# Patient Record
Sex: Female | Born: 1990 | Race: Black or African American | Hispanic: No | Marital: Single | Smoking: Never smoker
Health system: Southern US, Community
[De-identification: ages and names within clinical notes are randomized; demographics above are authoritative.]

## PROBLEM LIST (undated history)

## (undated) ENCOUNTER — Inpatient Hospital Stay (HOSPITAL_COMMUNITY): Payer: Self-pay

## (undated) DIAGNOSIS — Z332 Encounter for elective termination of pregnancy: Secondary | ICD-10-CM

## (undated) DIAGNOSIS — D649 Anemia, unspecified: Secondary | ICD-10-CM

## (undated) HISTORY — DX: Encounter for elective termination of pregnancy: Z33.2

## (undated) HISTORY — PX: INDUCED ABORTION: SHX677

## (undated) HISTORY — PX: TONSILLECTOMY AND ADENOIDECTOMY: SHX28

---
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
## 2011-01-01 LAB — DIFFERENTIAL
Basophils Absolute: 0.1 10*3/uL (ref 0.0–0.1)
Eosinophils Absolute: 0.1 10*3/uL (ref 0.0–0.7)
Eosinophils Relative: 2 % (ref 0–5)
Lymphocytes Relative: 33 % (ref 12–46)
Lymphs Abs: 1.3 10*3/uL (ref 0.7–4.0)

## 2011-01-01 LAB — URINE MICROSCOPIC-ADD ON

## 2011-01-01 LAB — URINALYSIS, ROUTINE W REFLEX MICROSCOPIC
Glucose, UA: NEGATIVE mg/dL
Specific Gravity, Urine: 1.023 (ref 1.005–1.030)

## 2011-01-01 LAB — COMPREHENSIVE METABOLIC PANEL
ALT: 18 U/L (ref 0–35)
AST: 21 U/L (ref 0–37)
Albumin: 3.5 g/dL (ref 3.5–5.2)
Alkaline Phosphatase: 53 U/L (ref 39–117)
BUN: 4 mg/dL — ABNORMAL LOW (ref 6–23)
GFR calc Af Amer: >60 mL/min (ref >60)
Potassium: 3.3 mEq/L — ABNORMAL LOW (ref 3.5–5.1)
Sodium: 137 mEq/L (ref 135–145)
Total Protein: 6.8 g/dL (ref 6.0–8.3)

## 2011-01-01 LAB — CBC
Platelets: 297 10*3/uL (ref 150–400)
RDW: 13 % (ref 11.5–15.5)

## 2011-01-01 LAB — POCT PREGNANCY, URINE: Preg Test, Ur: NEGATIVE

## 2011-01-01 LAB — LIPASE, BLOOD: Lipase: 20 U/L (ref 11–59)

## 2011-09-30 ENCOUNTER — Encounter: Payer: Self-pay | Admitting: Internal Medicine

## 2012-02-12 ENCOUNTER — Emergency Department (HOSPITAL_COMMUNITY)
Admission: EM | Admit: 2012-02-12 | Discharge: 2012-02-12 | Disposition: A | Discharge status: Home / Self Care | Payer: Self-pay | Attending: Emergency Medicine | Admitting: Emergency Medicine

## 2012-02-12 ENCOUNTER — Encounter (HOSPITAL_COMMUNITY): Payer: Self-pay | Admitting: *Deleted

## 2012-02-12 DIAGNOSIS — N390 Urinary tract infection, site not specified: Secondary | ICD-10-CM | POA: Insufficient documentation

## 2012-02-12 DIAGNOSIS — N949 Unspecified condition associated with female genital organs and menstrual cycle: Secondary | ICD-10-CM | POA: Insufficient documentation

## 2012-02-12 DIAGNOSIS — R300 Dysuria: Secondary | ICD-10-CM | POA: Insufficient documentation

## 2012-02-12 LAB — URINALYSIS, ROUTINE W REFLEX MICROSCOPIC
Ketones, ur: 15 mg/dL — AB
Nitrite: POSITIVE — AB
Specific Gravity, Urine: 1.033 — ABNORMAL HIGH (ref 1.005–1.030)
Urobilinogen, UA: 1 mg/dL (ref 0.0–1.0)
pH: 5.5 (ref 5.0–8.0)

## 2012-02-12 LAB — URINE MICROSCOPIC-ADD ON

## 2012-02-12 LAB — WET PREP, GENITAL

## 2012-02-12 MED ORDER — SULFAMETHOXAZOLE-TMP DS 800-160 MG PO TABS: 1.0000 | ORAL_TABLET | Freq: Two times a day (BID) | ORAL | Status: AC

## 2012-02-12 MED ORDER — PHENAZOPYRIDINE HCL 95 MG PO TABS: 95.0000 mg | ORAL_TABLET | Freq: Three times a day (TID) | ORAL | Status: AC | PRN

## 2012-02-12 MED ORDER — SULFAMETHOXAZOLE-TMP DS 800-160 MG PO TABS
1.0000 | ORAL_TABLET | Freq: Once | ORAL | Status: AC
  Administered 2012-02-12: 1 via ORAL
  Filled 2012-02-12: qty 1

## 2012-02-12 MED ORDER — PHENAZOPYRIDINE HCL 100 MG PO TABS
95.0000 mg | ORAL_TABLET | Freq: Once | ORAL | Status: AC
  Administered 2012-02-12: 100 mg via ORAL
  Filled 2012-02-12: qty 1

## 2012-02-12 NOTE — ED Notes (Signed)
C/o UTI, onset of sx Friday, describes sx of urethral pain & dysuria, sx worse after intercourse, (denies: vaginal sx, nvd, fever or back pain), urine noted to be dark amber, (took AZO PTA).

## 2012-02-12 NOTE — ED Notes (Signed)
Patient moved to CDU 7 for pelvic exam. Pelvic cart set up.

## 2012-02-12 NOTE — ED Provider Notes (Signed)
Medical screening examination/treatment/procedure(s) were performed by non-physician practitioner and as supervising physician I was immediately available for consultation/collaboration.    Ashritha Desrosiers D Maely Clements, MD 02/12/12 0702 

## 2012-02-12 NOTE — ED Provider Notes (Signed)
History     CSN: [id number]  Arrival date & time 02/12/12  4098   First MD Initiated Contact with Patient 02/12/12 0135      Chief Complaint  Patient presents with  . Urinary Tract Infection    (Consider location/radiation/quality/duration/timing/severity/associated sxs/prior treatment) HPI Comments: Patient started with dysuria.  Today, and dyspareunia.  This, afternoon, which is unusual for her.  She tried taking over-the-counter AZO without any relief  Patient is a 21 y.o. female presenting with urinary tract infection. The history is provided by the patient.  Urinary Tract Infection This is a new problem. The current episode started yesterday. The problem occurs constantly.    Past Medical History  Diagnosis Date  . Abortion on demand     Past Surgical History  Procedure Date  . Tonsillectomy and adenoidectomy     History reviewed. No pertinent family history.  History  Substance Use Topics  . Smoking status: Never Smoker   . Smokeless tobacco: Not on file  . Alcohol Use: Yes     occaisionally    OB History    Grav Para Term Preterm Abortions TAB SAB Ect Mult Living                  Review of Systems  Genitourinary: Positive for dysuria, frequency and vaginal pain. Negative for vaginal bleeding, vaginal discharge and genital sores.    Allergies  Review of patient's allergies indicates no known allergies.  Home Medications   Current Outpatient Rx  Name Route Sig Dispense Refill  . CYSTEX PO Oral Take 2 tablets by mouth once.    Marland Kitchen OVER THE COUNTER MEDICATION Oral Take 1 tablet by mouth once. AZO    . PHENAZOPYRIDINE HCL 95 MG PO TABS Oral Take 1 tablet (95 mg total) by mouth 3 (three) times daily as needed for pain. 10 tablet 0  . SULFAMETHOXAZOLE-TMP DS 800-160 MG PO TABS Oral Take 1 tablet by mouth 2 (two) times daily. 10 tablet 0    BP 153/88  Pulse 105  Temp(Src) 98 F (36.7 C) (Oral)  Resp 22  SpO2 99%  LMP 01/30/2012  Physical Exam    Constitutional: She appears well-developed and well-nourished.  HENT:  Head: Normocephalic.  Eyes: Pupils are equal, round, and reactive to light.  Neck: Normal range of motion.  Cardiovascular: Normal rate.   Pulmonary/Chest: Effort normal.  Abdominal: Soft.  Genitourinary: Vagina normal and uterus normal. No vaginal discharge found.  Musculoskeletal: Normal range of motion.  Neurological: She is alert.  Skin: Skin is warm.    ED Course  Procedures (including critical care time)  Labs Reviewed  URINALYSIS, ROUTINE W REFLEX MICROSCOPIC - Abnormal; Notable for the following:    Color, Urine ORANGE (*) BIOCHEMICALS MAY BE AFFECTED BY COLOR   APPearance TURBID (*)    Specific Gravity, Urine 1.033 (*)    Hgb urine dipstick LARGE (*)    Bilirubin Urine SMALL (*)    Ketones, ur 15 (*)    Protein, ur >300 (*)    Nitrite POSITIVE (*)    Leukocytes, UA LARGE (*)    All other components within normal limits  URINE MICROSCOPIC-ADD ON - Abnormal; Notable for the following:    Squamous Epithelial / LPF MANY (*)    Bacteria, UA FEW (*)    All other components within normal limits  POCT PREGNANCY, URINE  GC/CHLAMYDIA PROBE AMP, GENITAL  WET PREP, GENITAL   No results found.   1. Urinary tract infection  MDM   After review of patient's urine, she has a urinary tract infection, which I will treat with Pyridium and Septra or vaginal exam was benign and noncontributory to her symptoms        Arman Filter, NP 02/12/12 0320  Arman Filter, NP 02/12/12 1610  Arman Filter, NP 02/12/12 [id number]

## 2012-02-12 NOTE — Discharge Instructions (Signed)
Urinary Tract Infection A urinary tract infection (UTI) is often caused by a germ (bacteria). A UTI is usually helped with medicine (antibiotics) that kills germs. Take all the medicine until it is gone. Do this even if you are feeling better. You are usually better in 7 to 10 days. HOME CARE   Drink enough water and fluids to keep your pee (urine) clear or pale yellow. Drink:   Cranberry juice.   Water.   Avoid:   Caffeine.   Tea.   Bubbly (carbonated) drinks.   Alcohol.   Only take medicine as told by your doctor.   To prevent further infections:   Pee often.   After pooping (bowel movement), women should wipe from front to back. Use each tissue only once.   Pee before and after having sex (intercourse).  Ask your doctor when your test results will be ready. Make sure you follow up and get your test results.  GET HELP RIGHT AWAY IF:   There is very bad back pain or lower belly (abdominal) pain.   You get the chills.   You have a fever.   Your baby is older than 3 months with a rectal temperature of 102 F (38.9 C) or higher.   Your baby is 3 months old or younger with a rectal temperature of 100.4 F (38 C) or higher.   You feel sick to your stomach (nauseous) or throw up (vomit).   There is continued burning with peeing.   Your problems are not better in 3 days. Return sooner if you are getting worse.  MAKE SURE YOU:   Understand these instructions.   Will watch your condition.   Will get help right away if you are not doing well or get worse.  Document Released: 02/19/2008 Document Revised: 08/22/2011 Document Reviewed: 02/19/2008 Exit[employer] Patient Information 2012 [employer]. 

## 2012-02-13 LAB — GC/CHLAMYDIA PROBE AMP, GENITAL: Chlamydia, DNA Probe: NEGATIVE

## 2014-09-16 NOTE — L&D Delivery Note (Signed)
Delivery Note At 4:31 PM a viable female was delivered via Vaginal, Spontaneous Delivery (Presentation: Left Occiput Anterior).  APGAR: 9, 9; weight pending .   Placenta status: Intact, Spontaneous.  Cord: 3 vessels with the following complications: None.  Cord pH: n/a  Anesthesia: Epidural  Episiotomy: None Lacerations: Labial, right and left Suture Repair: 3.0 chromic Est. Blood Loss (mL):  Less than 300  Bladder drained with sterile technique ~ 200 cc.  Pt with large hemorrhoid.  Not thrombosed.  Anusol HC inserted s/p delivery.  Mom to postpartum.  Baby to Couplet care / Skin to Skin.  Geryl Rankins 04/28/2015, 5:11 PM

## 2014-09-30 LAB — OB RESULTS CONSOLE RUBELLA ANTIBODY, IGM: RUBELLA: IMMUNE

## 2014-09-30 LAB — OB RESULTS CONSOLE HIV ANTIBODY (ROUTINE TESTING): HIV: NONREACTIVE

## 2014-09-30 LAB — OB RESULTS CONSOLE RPR: RPR: NONREACTIVE

## 2014-09-30 LAB — OB RESULTS CONSOLE HEPATITIS B SURFACE ANTIGEN: HEP B S AG: NEGATIVE

## 2014-10-28 ENCOUNTER — Other Ambulatory Visit (HOSPITAL_COMMUNITY)
Admission: RE | Admit: 2014-10-28 | Discharge: 2014-10-28 | Disposition: A | Discharge status: Home / Self Care | Payer: BLUE CROSS/BLUE SHIELD | Source: Ambulatory Visit | Attending: Obstetrics & Gynecology | Admitting: Obstetrics & Gynecology

## 2014-10-28 ENCOUNTER — Other Ambulatory Visit: Payer: Self-pay | Admitting: Obstetrics & Gynecology

## 2014-10-28 DIAGNOSIS — Z01419 Encounter for gynecological examination (general) (routine) without abnormal findings: Secondary | ICD-10-CM | POA: Insufficient documentation

## 2014-10-28 DIAGNOSIS — Z113 Encounter for screening for infections with a predominantly sexual mode of transmission: Secondary | ICD-10-CM | POA: Diagnosis present

## 2014-10-31 LAB — CYTOLOGY - PAP

## 2014-11-29 ENCOUNTER — Encounter (HOSPITAL_COMMUNITY): Payer: Self-pay | Admitting: *Deleted

## 2014-11-29 ENCOUNTER — Inpatient Hospital Stay (HOSPITAL_COMMUNITY)
Admission: AD | Admit: 2014-11-29 | Discharge: 2014-11-29 | Disposition: A | Discharge status: Home / Self Care | Payer: BLUE CROSS/BLUE SHIELD | Source: Ambulatory Visit | Attending: Obstetrics & Gynecology | Admitting: Obstetrics & Gynecology

## 2014-11-29 DIAGNOSIS — Z3A16 16 weeks gestation of pregnancy: Secondary | ICD-10-CM | POA: Diagnosis not present

## 2014-11-29 DIAGNOSIS — R55 Syncope and collapse: Secondary | ICD-10-CM | POA: Diagnosis not present

## 2014-11-29 DIAGNOSIS — O210 Mild hyperemesis gravidarum: Secondary | ICD-10-CM | POA: Diagnosis not present

## 2014-11-29 DIAGNOSIS — O9989 Other specified diseases and conditions complicating pregnancy, childbirth and the puerperium: Secondary | ICD-10-CM | POA: Diagnosis not present

## 2014-11-29 LAB — URINALYSIS, ROUTINE W REFLEX MICROSCOPIC
Bilirubin Urine: NEGATIVE
Glucose, UA: NEGATIVE mg/dL
Hgb urine dipstick: NEGATIVE
KETONES UR: NEGATIVE mg/dL
LEUKOCYTES UA: NEGATIVE
NITRITE: NEGATIVE
PH: 5.5 (ref 5.0–8.0)
PROTEIN: NEGATIVE mg/dL
Specific Gravity, Urine: <1.005 — ABNORMAL LOW (ref 1.005–1.030)
Urobilinogen, UA: 0.2 mg/dL (ref 0.0–1.0)

## 2014-11-29 LAB — CBC WITH DIFFERENTIAL/PLATELET
BASOS ABS: 0 10*3/uL (ref 0.0–0.1)
BASOS PCT: 0 % (ref 0–1)
EOS ABS: 0.1 10*3/uL (ref 0.0–0.7)
EOS PCT: 1 % (ref 0–5)
HCT: 32.4 % — ABNORMAL LOW (ref 36.0–46.0)
Hemoglobin: 11.5 g/dL — ABNORMAL LOW (ref 12.0–15.0)
Lymphocytes Relative: 21 % (ref 12–46)
Lymphs Abs: 1.3 10*3/uL (ref 0.7–4.0)
MCH: 31.6 pg (ref 26.0–34.0)
MCHC: 35.5 g/dL (ref 30.0–36.0)
MCV: 89 fL (ref 78.0–100.0)
Monocytes Absolute: 0.4 10*3/uL (ref 0.1–1.0)
Monocytes Relative: 7 % (ref 3–12)
NEUTROS PCT: 71 % (ref 43–77)
Neutro Abs: 4.3 10*3/uL (ref 1.7–7.7)
PLATELETS: 234 10*3/uL (ref 150–400)
RBC: 3.64 MIL/uL — ABNORMAL LOW (ref 3.87–5.11)
RDW: 12.3 % (ref 11.5–15.5)
WBC: 6 10*3/uL (ref 4.0–10.5)

## 2014-11-29 LAB — GLUCOSE, CAPILLARY: GLUCOSE-CAPILLARY: 120 mg/dL — AB (ref 70–99)

## 2014-11-29 MED ORDER — LACTATED RINGERS IV BOLUS (SEPSIS)
1000.0000 mL | Freq: Once | INTRAVENOUS | Status: AC
  Administered 2014-11-29: 1000 mL via INTRAVENOUS

## 2014-11-29 NOTE — MAU Note (Signed)
Patient states she was at work when she felt nauseated and like she was going to pass out.  She states walking to the bathroom and next thing she remembers she was on the floor talking to her coworker who called EMS.  Pt presents to MAU alert and oriented.

## 2014-11-29 NOTE — Discharge Instructions (Signed)
It is important that you drink plenty of fluids every day and eat small meals frequently.  I spoke with Dr. Richardson Doppole and she wants you to see Dr. Charlotta Newtonzan this week for follow up. Return here as needed.

## 2014-11-29 NOTE — MAU Provider Note (Signed)
History     CSN: [id number]  Arrival date and time: 11/29/14 1839   First Provider Initiated Contact with Patient 11/29/14 1920      Chief Complaint  Patient presents with  . Nausea  . Near Syncope   HPI  [name] is a 24 y.o. G1P0 at 7445w2d who presents to MAU today by EMS after a syncopal episode. The patient states a lot of N/V throughout the pregnancy that is somewhat controlled with Zofran. She states that she began to feel nauseous at work today, stood up quickly and was walking to the bathroom when she felt dizzy. She states she then woke up on the floor on her side. She denies pain. She think that she fell on her side. She denies vaginal bleeding or discharge today.   OB History    Gravida Para Term Preterm AB TAB SAB Ectopic Multiple Living   1               Past Medical History  Diagnosis Date  . Abortion on demand   . Medical history non-contributory     Past Surgical History  Procedure Laterality Date  . Tonsillectomy and adenoidectomy    . Tonsillectomy      History reviewed. No pertinent family history.  History  Substance Use Topics  . Smoking status: Never Smoker   . Smokeless tobacco: Not on file  . Alcohol Use: No     Comment: occaisionally    Allergies: No Known Allergies  Prescriptions prior to admission  Medication Sig Dispense Refill Last Dose  . metoCLOPramide (REGLAN) 10 MG tablet Take 10 mg by mouth 2 (two) times daily.   Past Month at Unknown time  . ondansetron (ZOFRAN) 4 MG tablet Take 4 mg by mouth every 8 (eight) hours as needed for nausea or vomiting.   Past Week at Unknown time  . Prenatal Vit-Fe Fumarate-FA (PRENATAL MULTIVITAMIN) TABS tablet Take 1 tablet by mouth daily at 12 noon.   11/28/2014 at Unknown time    Review of Systems  Constitutional: Negative for fever and malaise/fatigue.  Gastrointestinal: Positive for nausea, vomiting and abdominal pain. Negative for diarrhea and constipation.  Genitourinary:  Negative for dysuria, urgency and frequency.       Neg - vaginal bleeding, discharge  Neurological: Positive for dizziness and weakness. Negative for loss of consciousness.   Physical Exam   Blood pressure 119/63, pulse 77, temperature 98.2 F (36.8 C), temperature source Oral, resp. rate 18, height 5\' 5"  (1.651 m), weight 165 lb (74.844 kg), last menstrual period 08/07/2014, SpO2 100 %.  Physical Exam  Constitutional: She is oriented to person, place, and time. She appears well-developed and well-nourished. No distress.  HENT:  Head: Normocephalic.  Cardiovascular: Normal rate.   Respiratory: Effort normal.  GI: Soft. She exhibits no distension and no mass. There is no tenderness. There is no rebound and no guarding.  Neurological: She is alert and oriented to person, place, and time.  Skin: Skin is warm and dry. No erythema.  Psychiatric: She has a normal mood and affect.   Results for orders placed or performed during the hospital encounter of 11/29/14 (from the past 24 hour(s))  CBC with Differential/Platelet     Status: Abnormal   Collection Time: 11/29/14  7:32 PM  Result Value Ref Range   WBC 6.0 4.0 - 10.5 K/uL   RBC 3.64 (L) 3.87 - 5.11 MIL/uL   Hemoglobin 11.5 (L) 12.0 - 15.0 g/dL   HCT  32.4 (L) 36.0 - 46.0 %   MCV 89.0 78.0 - 100.0 fL   MCH 31.6 26.0 - 34.0 pg   MCHC 35.5 30.0 - 36.0 g/dL   RDW 14.7 82.9 - 56.2 %   Platelets 234 150 - 400 K/uL   Neutrophils Relative % 71 43 - 77 %   Neutro Abs 4.3 1.7 - 7.7 K/uL   Lymphocytes Relative 21 12 - 46 %   Lymphs Abs 1.3 0.7 - 4.0 K/uL   Monocytes Relative 7 3 - 12 %   Monocytes Absolute 0.4 0.1 - 1.0 K/uL   Eosinophils Relative 1 0 - 5 %   Eosinophils Absolute 0.1 0.0 - 0.7 K/uL   Basophils Relative 0 0 - 1 %   Basophils Absolute 0.0 0.0 - 0.1 K/uL  Glucose, capillary     Status: Abnormal   Collection Time: 11/29/14  7:32 PM  Result Value Ref Range   Glucose-Capillary 120 (H) 70 - 99 mg/dL    MAU Course   Procedures None  MDM CBC, CBG, EKG today IV fluids ordered 2000 - Care turned over to [facility], NP. Waiting for EKG and receiving IV fluids.   Marny Lowenstein, PA-C  11/29/2014, 8:10 PM   Patient re assessed after IV hydration. She is feeling much better. States she has nausea and does not eat very much and has not had much to drink today.   2122 Consult with Dr. Richardson Dopp, will d/c patient home to follow up in the office later in the week.  Assessment and Plan  24 y.o. female @ [redacted]w[redacted]d gestation with syncopal episode while at work today. Stable for d/c with normal labs and EKG. Will follow up in the office this week. Discussed in detail with the patient plan of care. She voices understanding and agrees with plan.   EKG Normal sinus rhythm, Normal EKG

## 2015-03-13 ENCOUNTER — Encounter (HOSPITAL_COMMUNITY): Payer: Self-pay | Admitting: *Deleted

## 2015-03-13 ENCOUNTER — Inpatient Hospital Stay (HOSPITAL_COMMUNITY)
Admission: AD | Admit: 2015-03-13 | Discharge: 2015-03-13 | Disposition: A | Discharge status: Home / Self Care | Payer: BLUE CROSS/BLUE SHIELD | Source: Ambulatory Visit | Attending: Obstetrics & Gynecology | Admitting: Obstetrics & Gynecology

## 2015-03-13 ENCOUNTER — Inpatient Hospital Stay (HOSPITAL_COMMUNITY): Payer: BLUE CROSS/BLUE SHIELD

## 2015-03-13 DIAGNOSIS — Z3493 Encounter for supervision of normal pregnancy, unspecified, third trimester: Secondary | ICD-10-CM | POA: Diagnosis not present

## 2015-03-13 DIAGNOSIS — O219 Vomiting of pregnancy, unspecified: Secondary | ICD-10-CM

## 2015-03-13 DIAGNOSIS — Z3A31 31 weeks gestation of pregnancy: Secondary | ICD-10-CM | POA: Insufficient documentation

## 2015-03-13 DIAGNOSIS — O368131 Decreased fetal movements, third trimester, fetus 1: Secondary | ICD-10-CM

## 2015-03-13 DIAGNOSIS — O368390 Maternal care for abnormalities of the fetal heart rate or rhythm, unspecified trimester, not applicable or unspecified: Secondary | ICD-10-CM

## 2015-03-13 HISTORY — DX: Anemia, unspecified: D64.9

## 2015-03-13 LAB — URINALYSIS, ROUTINE W REFLEX MICROSCOPIC
GLUCOSE, UA: NEGATIVE mg/dL
Hgb urine dipstick: NEGATIVE
Ketones, ur: 40 mg/dL — AB
LEUKOCYTES UA: NEGATIVE
Nitrite: NEGATIVE
Protein, ur: >300 mg/dL — AB
Specific Gravity, Urine: >1.03 — ABNORMAL HIGH (ref 1.005–1.030)
UROBILINOGEN UA: 1 mg/dL (ref 0.0–1.0)
pH: 6 (ref 5.0–8.0)

## 2015-03-13 LAB — WET PREP, GENITAL
Clue Cells Wet Prep HPF POC: NONE SEEN
TRICH WET PREP: NONE SEEN

## 2015-03-13 LAB — URINE MICROSCOPIC-ADD ON

## 2015-03-13 MED ORDER — FLUCONAZOLE 150 MG PO TABS: 150.0000 mg | ORAL_TABLET | Freq: Once | ORAL | Status: DC

## 2015-03-13 MED ORDER — DEXTROSE 5 % IN LACTATED RINGERS IV BOLUS
1000.0000 mL | Freq: Once | INTRAVENOUS | Status: AC
  Administered 2015-03-13: 1000 mL via INTRAVENOUS

## 2015-03-13 MED ORDER — ONDANSETRON 4 MG PO TBDP: 4.0000 mg | ORAL_TABLET | Freq: Three times a day (TID) | ORAL | Status: DC | PRN

## 2015-03-13 MED ORDER — FLUCONAZOLE 150 MG PO TABS
150.0000 mg | ORAL_TABLET | Freq: Once | ORAL | Status: AC
  Administered 2015-03-13: 150 mg via ORAL
  Filled 2015-03-13: qty 1

## 2015-03-13 MED ORDER — ONDANSETRON HCL 40 MG/20ML IJ SOLN
8.0000 mg | Freq: Once | INTRAMUSCULAR | Status: AC
  Administered 2015-03-13: 8 mg via INTRAVENOUS
  Filled 2015-03-13: qty 4

## 2015-03-13 MED ORDER — LACTATED RINGERS IV BOLUS (SEPSIS)
1000.0000 mL | Freq: Once | INTRAVENOUS | Status: AC
  Administered 2015-03-13: 1000 mL via INTRAVENOUS

## 2015-03-13 MED ORDER — METOCLOPRAMIDE HCL 10 MG PO TABS: 10.0000 mg | ORAL_TABLET | Freq: Three times a day (TID) | ORAL | Status: DC

## 2015-03-13 NOTE — MAU Provider Note (Signed)
History     CSN: [id number]  Arrival date and time: 03/13/15 1410   None     Chief Complaint  Patient presents with  . Nausea  . Emesis  . Decreased Fetal Movement  . Vaginal Bleeding   HPI    [name] is a 24 y.o. female G1P0 at [redacted]w[redacted]d presenting to [facility] with vomiting; she has been vomiting everyday and this has been an ongoing problem throughout her pregnancy. She has vomited 6 times today which is how many times she normally vomits in a day.  She was taking medication for the vomiting however she attempted to stop the medication. She does not have medication at home for N/V  She feels that her babies kicks are much less than normal. She is feeling him kick, however not as much as normally.  Vaginal bleeding; she noticed spotting this morning when she urinated only. She saw it one time and has not noticed it again. She noticed a quarter size amount of blood on the toilet tissue.   OB History    Gravida Para Term Preterm AB TAB SAB Ectopic Multiple Living   1               Past Medical History  Diagnosis Date  . Abortion on demand   . Anemia     Past Surgical History  Procedure Laterality Date  . Tonsillectomy and adenoidectomy    . Tonsillectomy      History reviewed. No pertinent family history.  History  Substance Use Topics  . Smoking status: Never Smoker   . Smokeless tobacco: Not on file  . Alcohol Use: No     Comment: occaisionally    Allergies: No Known Allergies  Prescriptions prior to admission  Medication Sig Dispense Refill Last Dose  . Prenatal Vit-Fe Fumarate-FA (PRENATAL MULTIVITAMIN) TABS tablet Take 1 tablet by mouth daily at 12 noon.   03/12/2015 at Unknown time   Results for orders placed or performed during the hospital encounter of 03/13/15 (from the past 48 hour(s))  Urinalysis, Routine w reflex microscopic (not at [facility])     Status: Abnormal   Collection Time: 03/13/15  2:16 PM  Result Value Ref Range   Color, Urine YELLOW  YELLOW   APPearance CLOUDY (A) CLEAR   Specific Gravity, Urine >1.030 (H) 1.005 - 1.030   pH 6.0 5.0 - 8.0   Glucose, UA NEGATIVE NEGATIVE mg/dL   Hgb urine dipstick NEGATIVE NEGATIVE   Bilirubin Urine SMALL (A) NEGATIVE   Ketones, ur 40 (A) NEGATIVE mg/dL   Protein, ur >329 (A) NEGATIVE mg/dL   Urobilinogen, UA 1.0 0.0 - 1.0 mg/dL   Nitrite NEGATIVE NEGATIVE   Leukocytes, UA NEGATIVE NEGATIVE  Urine microscopic-add on     Status: Abnormal   Collection Time: 03/13/15  2:16 PM  Result Value Ref Range   Squamous Epithelial / LPF FEW (A) RARE   WBC, UA 0-2 <3 WBC/hpf   RBC / HPF 0-2 <3 RBC/hpf   Bacteria, UA RARE RARE   Urine-Other MUCOUS PRESENT   Wet prep, genital     Status: Abnormal   Collection Time: 03/13/15  3:20 PM  Result Value Ref Range   Yeast Wet Prep HPF POC MANY (A) NONE SEEN   Trich, Wet Prep NONE SEEN NONE SEEN   Clue Cells Wet Prep HPF POC NONE SEEN NONE SEEN   WBC, Wet Prep HPF POC MODERATE (A) NONE SEEN    Comment: MODERATE BACTERIA SEEN  Review of Systems  Eyes: Negative for blurred vision.  Gastrointestinal: Positive for nausea, vomiting and abdominal pain (cramping at times ). Negative for diarrhea and constipation.  Neurological: Negative for headaches.   Physical Exam   Blood pressure 133/82, pulse 76, temperature 97.7 F (36.5 C), temperature source Oral, resp. rate 18, height 5\' 5"  (1.651 m), weight 75.932 kg (167 lb 6.4 oz), last menstrual period 08/07/2014.  Physical Exam  Constitutional: She is oriented to person, place, and time. She appears well-developed and well-nourished. No distress.  HENT:  Head: Normocephalic.  Eyes: Pupils are equal, round, and reactive to light.  Neck: Neck supple.  Genitourinary:  Speculum exam: Vagina - large amount of thick, white, clumpy discharge, no odor Cervix - No contact bleeding Bimanual exam: Cervix closed, posterior  Uterus non tender, normal size Adnexa non tender, no masses bilaterally  Wet prep  done Chaperone present for exam.  Neurological: She is alert and oriented to person, place, and time.  Skin: Skin is warm. She is not diaphoretic.  Psychiatric: Her behavior is normal.    Fetal Tracing: Baseline: 120 Variability: moderate Accelerations: 15x15 Decelerations: quick variables  Toco: UI  MAU Course  Procedures  None  MDM  D5LR bolus   Zofran 8 mg IV  Diflucan 150 mg PO   BPP 8/8  LR bolus   Discussed this patient with Dr. Charlotta Newtonzan   Assessment and Plan   A:  1. Nausea and vomiting in pregnancy   2. Variable fetal heart rate decelerations, antepartum   3. Decreased fetal movement, third trimester, fetus 1     P:  Discharge home in stable condition Fetal kick counts  RX: Reglan and Zofran Follow up with Dr. Chestine Sporelark as scheduled  Preterm labor precautions  Return to MAU if symptoms worsen   Duane LopeJennifer I Rasch, NP 03/13/2015 3:28 PM

## 2015-03-13 NOTE — Discharge Instructions (Signed)
Hyperemesis Gravidarum °Hyperemesis gravidarum is a severe form of nausea and vomiting that happens during pregnancy. Hyperemesis is worse than morning sickness. It may cause you to have nausea or vomiting all day for many days. It may keep you from eating and drinking enough food and liquids. Hyperemesis usually occurs during the first half (the first 20 weeks) of pregnancy. It often goes away once a woman is in her second half of pregnancy. However, sometimes hyperemesis continues through an entire pregnancy.  °CAUSES  °The cause of this condition is not completely known but is thought to be related to changes in the body's hormones when pregnant. It could be from the high level of the pregnancy hormone or an increase in estrogen in the body.  °SIGNS AND SYMPTOMS  °· Severe nausea and vomiting. °· Nausea that does not go away. °· Vomiting that does not allow you to keep any food down. °· Weight loss and body fluid loss (dehydration). °· Having no desire to eat or not liking food you have previously enjoyed. °DIAGNOSIS  °Your health care provider will do a physical exam and ask you about your symptoms. He or she may also order blood tests and urine tests to make sure something else is not causing the problem.  °TREATMENT  °You may only need medicine to control the problem. If medicines do not control the nausea and vomiting, you will be treated in the hospital to prevent dehydration, increased acid in the blood (acidosis), weight loss, and changes in the electrolytes in your body that may harm the unborn baby (fetus). You may need IV fluids.  °HOME CARE INSTRUCTIONS  °· Only take over-the-counter or prescription medicines as directed by your health care provider. °· Try eating a couple of dry crackers or toast in the morning before getting out of bed. °· Avoid foods and smells that upset your stomach. °· Avoid fatty and spicy foods. °· Eat 5-6 small meals a day. °· Do not drink when eating meals. Drink between  meals. °· For snacks, eat high-protein foods, such as cheese. °· Eat or suck on things that have ginger in them. Ginger helps nausea. °· Avoid food preparation. The smell of food can spoil your appetite. °· Avoid iron pills and iron in your multivitamins until after 3-4 months of being pregnant. However, consult with your health care provider before stopping any prescribed iron pills. °SEEK MEDICAL CARE IF:  °· Your abdominal pain increases. °· You have a severe headache. °· You have vision problems. °· You are losing weight. °SEEK IMMEDIATE MEDICAL CARE IF:  °· You are unable to keep fluids down. °· You vomit blood. °· You have constant nausea and vomiting. °· You have excessive weakness. °· You have extreme thirst. °· You have dizziness or fainting. °· You have a fever or persistent symptoms for more than 2-3 days. °· You have a fever and your symptoms suddenly get worse. °MAKE SURE YOU:  °· Understand these instructions. °· Will watch your condition. °· Will get help right away if you are not doing well or get worse. °Document Released: 09/02/2005 Document Revised: 06/23/2013 Document Reviewed: 04/14/2013 °[employer]® Patient Information ©2015 [employer]. This information is not intended to replace advice given to you by your health care provider. Make sure you discuss any questions you have with your health care provider. ° °

## 2015-03-13 NOTE — MAU Note (Signed)
Pt presents complaining of vomiting that has been going on the whole pregnancy but was worse this am. Had some spotting with wiping. Also reports decreased fetal movement.

## 2015-04-13 LAB — OB RESULTS CONSOLE GBS: GBS: NEGATIVE

## 2015-04-14 ENCOUNTER — Inpatient Hospital Stay (HOSPITAL_COMMUNITY)
Admission: AD | Admit: 2015-04-14 | Discharge: 2015-04-14 | Disposition: A | Discharge status: Home / Self Care | Payer: BLUE CROSS/BLUE SHIELD | Source: Ambulatory Visit | Attending: Obstetrics & Gynecology | Admitting: Obstetrics & Gynecology

## 2015-04-14 ENCOUNTER — Encounter (HOSPITAL_COMMUNITY): Payer: Self-pay | Admitting: *Deleted

## 2015-04-14 DIAGNOSIS — Z3A35 35 weeks gestation of pregnancy: Secondary | ICD-10-CM | POA: Insufficient documentation

## 2015-04-14 DIAGNOSIS — R030 Elevated blood-pressure reading, without diagnosis of hypertension: Secondary | ICD-10-CM | POA: Diagnosis present

## 2015-04-14 DIAGNOSIS — O133 Gestational [pregnancy-induced] hypertension without significant proteinuria, third trimester: Secondary | ICD-10-CM | POA: Diagnosis not present

## 2015-04-14 LAB — COMPREHENSIVE METABOLIC PANEL
ALBUMIN: 3.1 g/dL — AB (ref 3.5–5.0)
ALT: 11 U/L — ABNORMAL LOW (ref 14–54)
AST: 19 U/L (ref 15–41)
Alkaline Phosphatase: 164 U/L — ABNORMAL HIGH (ref 38–126)
Anion gap: 5 (ref 5–15)
BUN: <5 mg/dL — ABNORMAL LOW (ref 6–20)
CALCIUM: 8.1 mg/dL — AB (ref 8.9–10.3)
CHLORIDE: 109 mmol/L (ref 101–111)
CO2: 20 mmol/L — AB (ref 22–32)
Creatinine, Ser: 0.7 mg/dL (ref 0.44–1.00)
GLUCOSE: 80 mg/dL (ref 65–99)
Potassium: 3.5 mmol/L (ref 3.5–5.1)
Sodium: 134 mmol/L — ABNORMAL LOW (ref 135–145)
TOTAL PROTEIN: 6.2 g/dL — AB (ref 6.5–8.1)
Total Bilirubin: 0.8 mg/dL (ref 0.3–1.2)

## 2015-04-14 LAB — CBC
HCT: 29.7 % — ABNORMAL LOW (ref 36.0–46.0)
HEMOGLOBIN: 10.4 g/dL — AB (ref 12.0–15.0)
MCH: 31 pg (ref 26.0–34.0)
MCHC: 35 g/dL (ref 30.0–36.0)
MCV: 88.4 fL (ref 78.0–100.0)
PLATELETS: 194 10*3/uL (ref 150–400)
RBC: 3.36 MIL/uL — AB (ref 3.87–5.11)
RDW: 12.8 % (ref 11.5–15.5)
WBC: 8.1 10*3/uL (ref 4.0–10.5)

## 2015-04-14 LAB — PROTEIN / CREATININE RATIO, URINE
Creatinine, Urine: 84 mg/dL
Total Protein, Urine: <6 mg/dL

## 2015-04-14 LAB — URINALYSIS, ROUTINE W REFLEX MICROSCOPIC
BILIRUBIN URINE: NEGATIVE
GLUCOSE, UA: NEGATIVE mg/dL
Hgb urine dipstick: NEGATIVE
KETONES UR: NEGATIVE mg/dL
Nitrite: NEGATIVE
Protein, ur: NEGATIVE mg/dL
Specific Gravity, Urine: 1.01 (ref 1.005–1.030)
Urobilinogen, UA: 0.2 mg/dL (ref 0.0–1.0)
pH: 5.5 (ref 5.0–8.0)

## 2015-04-14 LAB — URINE MICROSCOPIC-ADD ON

## 2015-04-14 NOTE — Discharge Instructions (Signed)

## 2015-04-14 NOTE — MAU Provider Note (Signed)
Chief Complaint:  Hypertension   First Provider Initiated Contact with Patient 04/14/15 1424      HPI: Kathleen Mccall is a 24 y.o. G1P0 at [redacted]w[redacted]d who presents to maternity admissions reporting elevated BP's 130-140/90's at home today. BP elevated for the first time at her prenatal visit earlier this week. Reports mild, intermittent headaches over the last week and a half that resolve with resting in a dark room. Has not needed to take any pain medication for these headaches. Also reports seeing dark spots moving away from her field of vision for the past week. Saw her eye doctor today. She states she was told that her vision had not changed but her eye exam showed evidence of hypertension.   Denies contractions, leakage of fluid or vaginal bleeding. Good fetal movement.   Pregnancy Course: Uncomplicated.  Past Medical History: Past Medical History  Diagnosis Date  . Abortion on demand   . Anemia     Past obstetric history: OB History  Gravida Para Term Preterm AB SAB TAB Ectopic Multiple Living  # Outcome Date GA Lbr Len/2nd Weight Sex Delivery Anes PTL Lv  2 Current           1 TAB               Past Surgical History: Past Surgical History  Procedure Laterality Date  . Tonsillectomy and adenoidectomy    . Tonsillectomy    . Induced abortion       Family History: Family History  Problem Relation Age of Onset  . Diabetes Mother   . Diabetes Maternal Grandfather   . Heart disease Maternal Grandfather     Social History: History  Substance Use Topics  . Smoking status: Never Smoker   . Smokeless tobacco: Not on file  . Alcohol Use: No     Comment: occaisionally    Allergies: No Known Allergies  Meds:  Prescriptions prior to admission  Medication Sig Dispense Refill Last Dose  . metoCLOPramide (REGLAN) 10 MG tablet Take 1 tablet (10 mg total) by mouth 3 (three) times daily before meals. 90 tablet 1 04/14/2015 at Unknown time  . ondansetron (ZOFRAN  ODT) 4 MG disintegrating tablet Take 1 tablet (4 mg total) by mouth every 8 (eight) hours as needed for nausea or vomiting. 20 tablet 0 04/13/2015 at Unknown time  . Prenatal Vit-Fe Fumarate-FA (PRENATAL MULTIVITAMIN) TABS tablet Take 1 tablet by mouth daily at 12 noon.   04/14/2015 at Unknown time  . fluconazole (DIFLUCAN) 150 MG tablet Take 1 tablet (150 mg total) by mouth once. Take on 03/16/2015 (Patient not taking: Reported on 04/14/2015) 1 tablet 0 Not Taking at Unknown time    ROS:  Review of Systems  Constitutional: Negative for fever and chills.    Physical Exam  Blood pressure 142/82, pulse 76, temperature 98.9 F (37.2 C), temperature source Oral, resp. rate 16, last menstrual period 08/07/2014. GENERAL: Well-developed, well-nourished female in no acute distress.  HEART: normal rate RESP: normal effort GI: Abd soft, non-tender, gravid appropriate for gestational age. Pos BS x 4 MS: Extremities nontender, no edema, normal ROM NEURO: Alert and oriented x 4.  GU: NEFG, physiologic discharge, no blood, cervix clean. No CVAT    FHT:  Baseline 125 , moderate variability, accelerations present, variable deceleration 1. Monitored for an additional hour. No further decelerations. Contractions: q 2-3 mins, painless.   Labs: Results for orders placed  or performed during the hospital encounter of 04/14/15 (from the past 24 hour(s))  Protein / creatinine ratio, urine     Status: None   Collection Time: 04/14/15  2:10 PM  Result Value Ref Range   Creatinine, Urine 84.00 mg/dL   Total Protein, Urine <6 mg/dL   Protein Creatinine Ratio        0.00 - 0.15 mg/mg[Cre]  Urinalysis, Routine w reflex microscopic (not at [facility])     Status: Abnormal   Collection Time: 04/14/15  2:10 PM  Result Value Ref Range   Color, Urine YELLOW YELLOW   APPearance CLEAR CLEAR   Specific Gravity, Urine 1.010 1.005 - 1.030   pH 5.5 5.0 - 8.0   Glucose, UA NEGATIVE NEGATIVE mg/dL   Hgb urine dipstick NEGATIVE  NEGATIVE   Bilirubin Urine NEGATIVE NEGATIVE   Ketones, ur NEGATIVE NEGATIVE mg/dL   Protein, ur NEGATIVE NEGATIVE mg/dL   Urobilinogen, UA 0.2 0.0 - 1.0 mg/dL   Nitrite NEGATIVE NEGATIVE   Leukocytes, UA SMALL (A) NEGATIVE  Urine microscopic-add on     Status: Abnormal   Collection Time: 04/14/15  2:10 PM  Result Value Ref Range   Squamous Epithelial / LPF FEW (A) RARE   WBC, UA 0-2 <3 WBC/hpf  CBC     Status: Abnormal   Collection Time: 04/14/15  2:40 PM  Result Value Ref Range   WBC 8.1 4.0 - 10.5 K/uL   RBC 3.36 (L) 3.87 - 5.11 MIL/uL   Hemoglobin 10.4 (L) 12.0 - 15.0 g/dL   HCT 40.9 (L) 81.1 - 91.4 %   MCV 88.4 78.0 - 100.0 fL   MCH 31.0 26.0 - 34.0 pg   MCHC 35.0 30.0 - 36.0 g/dL   RDW 78.2 95.6 - 21.3 %   Platelets 194 150 - 400 K/uL  Comprehensive metabolic panel     Status: Abnormal   Collection Time: 04/14/15  2:40 PM  Result Value Ref Range   Sodium 134 (L) 135 - 145 mmol/L   Potassium 3.5 3.5 - 5.1 mmol/L   Chloride 109 101 - 111 mmol/L   CO2 20 (L) 22 - 32 mmol/L   Glucose, Bld 80 65 - 99 mg/dL   BUN <5 (L) 6 - 20 mg/dL   Creatinine, Ser 0.86 0.44 - 1.00 mg/dL   Calcium 8.1 (L) 8.9 - 10.3 mg/dL   Total Protein 6.2 (L) 6.5 - 8.1 g/dL   Albumin 3.1 (L) 3.5 - 5.0 g/dL   AST 19 15 - 41 U/L   ALT 11 (L) 14 - 54 U/L   Alkaline Phosphatase 164 (H) 38 - 126 U/L   Total Bilirubin 0.8 0.3 - 1.2 mg/dL   GFR calc non Af Amer >60 >60 mL/min   GFR calc Af Amer >60 >60 mL/min   Anion gap 5 5 - 15    Imaging:  No results found.  MAU Course: CBC, CMP, UA, protein creatinine ratio.  Discussed blood pressures, lab work, headache and vision changes with Dr. Charlotta Newton. With only borderline to mildly elevated blood pressures and normal lab work, highly doubt headache and vision changes are cerebral edema. Stable for discharge with follow-up in the office in 3 days per Dr. Charlotta Newton.  Assessment: 1. Gestational hypertension w/o significant proteinuria in 3rd trimester     Plan: Discharge home in stable condition per consult with Dr. Charlotta Newton.  Preterm labor precautions and fetal kick counts. Preeclampsia precautions.     Follow-up Information    Follow up  with Myna Hidalgo, M, DO On 04/17/2015.   Specialty:  Obstetrics and Gynecology   Contact information:   [address] [place] [address] [phone]       Follow up with [facility] [place] MATERNITY ADMISSIONS.   Why:  As needed in emergencies   Contact information:   [address] [id number] mc [place] [address] [phone]         Medication List    STOP taking these medications        fluconazole 150 MG tablet  Commonly known as:  DIFLUCAN      TAKE these medications        metoCLOPramide 10 MG tablet  Commonly known as:  REGLAN  Take 1 tablet (10 mg total) by mouth 3 (three) times daily before meals.     ondansetron 4 MG disintegrating tablet  Commonly known as:  ZOFRAN ODT  Take 1 tablet (4 mg total) by mouth every 8 (eight) hours as needed for nausea or vomiting.     prenatal multivitamin Tabs tablet  Take 1 tablet by mouth daily at 12 noon.        [place], [place] 04/14/2015 4:43 PM

## 2015-04-27 ENCOUNTER — Encounter (HOSPITAL_COMMUNITY): Payer: Self-pay | Admitting: *Deleted

## 2015-04-27 ENCOUNTER — Inpatient Hospital Stay (HOSPITAL_COMMUNITY)
Admission: AD | Admit: 2015-04-27 | Discharge: 2015-04-30 | DRG: 775 | Disposition: A | Discharge status: Home / Self Care | Payer: BLUE CROSS/BLUE SHIELD | Source: Ambulatory Visit | Attending: Obstetrics & Gynecology | Admitting: Obstetrics & Gynecology

## 2015-04-27 DIAGNOSIS — F1290 Cannabis use, unspecified, uncomplicated: Secondary | ICD-10-CM | POA: Diagnosis present

## 2015-04-27 DIAGNOSIS — Z3A37 37 weeks gestation of pregnancy: Secondary | ICD-10-CM | POA: Diagnosis present

## 2015-04-27 DIAGNOSIS — O2243 Hemorrhoids in pregnancy, third trimester: Secondary | ICD-10-CM | POA: Diagnosis present

## 2015-04-27 DIAGNOSIS — O365930 Maternal care for other known or suspected poor fetal growth, third trimester, not applicable or unspecified: Secondary | ICD-10-CM | POA: Diagnosis present

## 2015-04-27 DIAGNOSIS — O133 Gestational [pregnancy-induced] hypertension without significant proteinuria, third trimester: Secondary | ICD-10-CM | POA: Diagnosis present

## 2015-04-27 DIAGNOSIS — O99324 Drug use complicating childbirth: Secondary | ICD-10-CM | POA: Diagnosis present

## 2015-04-27 LAB — CBC
HEMATOCRIT: 29.2 % — AB (ref 36.0–46.0)
Hemoglobin: 10 g/dL — ABNORMAL LOW (ref 12.0–15.0)
MCH: 30.3 pg (ref 26.0–34.0)
MCHC: 34.2 g/dL (ref 30.0–36.0)
MCV: 88.5 fL (ref 78.0–100.0)
Platelets: 191 10*3/uL (ref 150–400)
RBC: 3.3 MIL/uL — ABNORMAL LOW (ref 3.87–5.11)
RDW: 12.9 % (ref 11.5–15.5)
WBC: 5.8 10*3/uL (ref 4.0–10.5)

## 2015-04-27 LAB — URINE MICROSCOPIC-ADD ON

## 2015-04-27 LAB — URINALYSIS, ROUTINE W REFLEX MICROSCOPIC
Bilirubin Urine: NEGATIVE
GLUCOSE, UA: NEGATIVE mg/dL
Ketones, ur: NEGATIVE mg/dL
Nitrite: NEGATIVE
Protein, ur: NEGATIVE mg/dL
Specific Gravity, Urine: 1.01 (ref 1.005–1.030)
Urobilinogen, UA: 0.2 mg/dL (ref 0.0–1.0)
pH: 6 (ref 5.0–8.0)

## 2015-04-27 LAB — COMPREHENSIVE METABOLIC PANEL
ALT: 16 U/L (ref 14–54)
AST: 27 U/L (ref 15–41)
Albumin: 3 g/dL — ABNORMAL LOW (ref 3.5–5.0)
Alkaline Phosphatase: 161 U/L — ABNORMAL HIGH (ref 38–126)
Anion gap: 4 — ABNORMAL LOW (ref 5–15)
BUN: 7 mg/dL (ref 6–20)
CALCIUM: 8.5 mg/dL — AB (ref 8.9–10.3)
CO2: 21 mmol/L — AB (ref 22–32)
CREATININE: 0.71 mg/dL (ref 0.44–1.00)
Chloride: 109 mmol/L (ref 101–111)
GFR calc Af Amer: >60 mL/min (ref >60)
GLUCOSE: 98 mg/dL (ref 65–99)
Potassium: 3.7 mmol/L (ref 3.5–5.1)
Sodium: 134 mmol/L — ABNORMAL LOW (ref 135–145)
Total Bilirubin: 0.8 mg/dL (ref 0.3–1.2)
Total Protein: 5.9 g/dL — ABNORMAL LOW (ref 6.5–8.1)

## 2015-04-27 LAB — TYPE AND SCREEN
ABO/RH(D): O POS
ANTIBODY SCREEN: NEGATIVE

## 2015-04-27 LAB — PROTEIN / CREATININE RATIO, URINE
Creatinine, Urine: 73 mg/dL
Total Protein, Urine: <6 mg/dL

## 2015-04-27 LAB — ABO/RH: ABO/RH(D): O POS

## 2015-04-27 LAB — URIC ACID: Uric Acid, Serum: 4.1 mg/dL (ref 2.3–6.6)

## 2015-04-27 MED ORDER — MISOPROSTOL 25 MCG QUARTER TABLET
25.0000 ug | ORAL_TABLET | ORAL | Status: DC | PRN
  Administered 2015-04-27: 25 ug via VAGINAL
  Filled 2015-04-27: qty 1
  Filled 2015-04-27 (×2): qty 0.25

## 2015-04-27 MED ORDER — HYDRALAZINE HCL 20 MG/ML IJ SOLN: 10.0000 mg | Freq: Once | INTRAMUSCULAR | Status: DC | PRN

## 2015-04-27 MED ORDER — ACETAMINOPHEN 325 MG PO TABS: 650.0000 mg | ORAL_TABLET | ORAL | Status: DC | PRN

## 2015-04-27 MED ORDER — OXYCODONE-ACETAMINOPHEN 5-325 MG PO TABS: 2.0000 | ORAL_TABLET | ORAL | Status: DC | PRN

## 2015-04-27 MED ORDER — LABETALOL HCL 5 MG/ML IV SOLN
20.0000 mg | INTRAVENOUS | Status: DC | PRN
  Administered 2015-04-28: 40 mg via INTRAVENOUS
  Filled 2015-04-27: qty 8
  Filled 2015-04-27: qty 4

## 2015-04-27 MED ORDER — LABETALOL HCL 5 MG/ML IV SOLN
20.0000 mg | Freq: Once | INTRAVENOUS | Status: AC
  Administered 2015-04-28: 20 mg via INTRAVENOUS
  Filled 2015-04-27: qty 4

## 2015-04-27 MED ORDER — OXYCODONE-ACETAMINOPHEN 5-325 MG PO TABS: 1.0000 | ORAL_TABLET | ORAL | Status: DC | PRN

## 2015-04-27 MED ORDER — LIDOCAINE HCL (PF) 1 % IJ SOLN
30.0000 mL | INTRAMUSCULAR | Status: DC | PRN
  Filled 2015-04-27: qty 30

## 2015-04-27 MED ORDER — TERBUTALINE SULFATE 1 MG/ML IJ SOLN
0.2500 mg | Freq: Once | INTRAMUSCULAR | Status: DC | PRN
  Filled 2015-04-27: qty 1

## 2015-04-27 MED ORDER — ONDANSETRON HCL 4 MG/2ML IJ SOLN
4.0000 mg | Freq: Four times a day (QID) | INTRAMUSCULAR | Status: DC | PRN
  Administered 2015-04-27: 4 mg via INTRAVENOUS
  Filled 2015-04-27: qty 2

## 2015-04-27 MED ORDER — LACTATED RINGERS IV SOLN
INTRAVENOUS | Status: DC
  Administered 2015-04-27 – 2015-04-28 (×4): via INTRAVENOUS

## 2015-04-27 MED ORDER — LACTATED RINGERS IV SOLN
500.0000 mL | INTRAVENOUS | Status: DC | PRN
  Administered 2015-04-27: 500 mL via INTRAVENOUS

## 2015-04-27 MED ORDER — OXYTOCIN BOLUS FROM INFUSION: 500.0000 mL | INTRAVENOUS | Status: DC

## 2015-04-27 MED ORDER — BUTORPHANOL TARTRATE 2 MG/ML IJ SOLN
2.0000 mg | INTRAMUSCULAR | Status: DC | PRN
  Administered 2015-04-27 – 2015-04-28 (×2): 2 mg via INTRAVENOUS
  Filled 2015-04-27 (×3): qty 2

## 2015-04-27 MED ORDER — CITRIC ACID-SODIUM CITRATE 334-500 MG/5ML PO SOLN: 30.0000 mL | ORAL | Status: DC | PRN

## 2015-04-27 MED ORDER — ZOLPIDEM TARTRATE 5 MG PO TABS
5.0000 mg | ORAL_TABLET | Freq: Every evening | ORAL | Status: DC | PRN
  Administered 2015-04-28: 5 mg via ORAL
  Filled 2015-04-27: qty 1

## 2015-04-27 MED ORDER — OXYTOCIN 40 UNITS IN LACTATED RINGERS INFUSION - SIMPLE MED
62.5000 mL/h | INTRAVENOUS | Status: DC
  Administered 2015-04-28: 62.5 mL/h via INTRAVENOUS

## 2015-04-27 NOTE — Progress Notes (Addendum)
OB PN:  S: Pt resting comfortably, no acute complaints  O: BP 145/87 mmHg  Pulse 79  Temp(Src) 98.2 F (36.8 C) (Oral)  Resp 18  Ht  (1.651 m)  Wt 80.287 kg (177 lb)  BMI 29.45 kg/m2  LMP 08/07/2014 BP range: 136-170/87-110  FHT: 115bpm, moderate variablity, + accels, no decels Toco: q2-49min SVE: 1/80/-3, Foley balloon placed without difficulty  Labs: Hgb 10.0, plt 191, BUN/Cr: 7/0.71, AST/ALT: 27/16  A/P: 24 y.o. G2P0010 @ [redacted]w[redacted]d EGA who presents for IOL due to gestational HTN -FWB: NICHD Cat I FHTs -Labor: Foley placed to remain in place x 6hrs, will start Pit per protocol -GBS: negative -Gestational HTN: BP as above, Labetalol protocol ordered due to 170/110; however repeat BP improved.  Will monitor q 1hr and give IV Labetalol if > 160/110.  HA remains unchanged.  PC ratio pending.  Labs stable as above.  Myna Hidalgo, DO [phone] (pager) [phone] (office)

## 2015-04-27 NOTE — Progress Notes (Signed)
Kathleen Mccall MRN: [id number]  Subjective: -Care assumed of 24y.o. G2P0 at 37.4wks who is a patient of [facility].  Patient presents for IOL secondary to [facility].  In room to meet acquaintance.  Mother and FOB at bedside, supportive.  Patient in bathroom.  Patient ambulates to bed without issues.  Denies visual disturbances, epigastric pain, and reports mild headache.  Patient expresses desire to labor and birth without pain medication, but is "open to options."  Reports contractions are bearable.  Objective: BP 144/95 mmHg  Pulse 55  Temp(Src) 98.3 F (36.8 C) (Oral)  Resp 16  Ht 5\' 5"  (1.651 m)  Wt 80.287 kg (177 lb)  BMI 29.45 kg/m2  SpO2 99%  LMP 08/07/2014       Results for orders placed or performed during the hospital encounter of 04/27/15 (from the past 24 hour(s))  CBC     Status: Abnormal   Collection Time: 04/27/15 12:35 PM  Result Value Ref Range   WBC 5.8 4.0 - 10.5 K/uL   RBC 3.30 (L) 3.87 - 5.11 MIL/uL   Hemoglobin 10.0 (L) 12.0 - 15.0 g/dL   HCT 40.9 (L) 81.1 - 91.4 %   MCV 88.5 78.0 - 100.0 fL   MCH 30.3 26.0 - 34.0 pg   MCHC 34.2 30.0 - 36.0 g/dL   RDW 78.2 95.6 - 21.3 %   Platelets 191 150 - 400 K/uL  Type and screen     Status: None   Collection Time: 04/27/15 12:35 PM  Result Value Ref Range   ABO/RH(D) O POS    Antibody Screen NEG    Sample Expiration 04/30/2015   Comprehensive metabolic panel     Status: Abnormal   Collection Time: 04/27/15 12:35 PM  Result Value Ref Range   Sodium 134 (L) 135 - 145 mmol/L   Potassium 3.7 3.5 - 5.1 mmol/L   Chloride 109 101 - 111 mmol/L   CO2 21 (L) 22 - 32 mmol/L   Glucose, Bld 98 65 - 99 mg/dL   BUN 7 6 - 20 mg/dL   Creatinine, Ser 0.86 0.44 - 1.00 mg/dL   Calcium 8.5 (L) 8.9 - 10.3 mg/dL   Total Protein 5.9 (L) 6.5 - 8.1 g/dL   Albumin 3.0 (L) 3.5 - 5.0 g/dL   AST 27 15 - 41 U/L   ALT 16 14 - 54 U/L   Alkaline Phosphatase 161 (H) 38 - 126 U/L   Total Bilirubin 0.8 0.3 - 1.2 mg/dL   GFR calc non Af Amer  >60 >60 mL/min   GFR calc Af Amer >60 >60 mL/min   Anion gap 4 (L) 5 - 15  Uric acid     Status: None   Collection Time: 04/27/15 12:35 PM  Result Value Ref Range   Uric Acid, Serum 4.1 2.3 - 6.6 mg/dL  ABO/Rh     Status: None   Collection Time: 04/27/15 12:35 PM  Result Value Ref Range   ABO/RH(D) O POS   Urinalysis, Routine w reflex microscopic (not at [facility])     Status: Abnormal   Collection Time: 04/27/15  5:24 PM  Result Value Ref Range   Color, Urine YELLOW YELLOW   APPearance CLEAR CLEAR   Specific Gravity, Urine 1.010 1.005 - 1.030   pH 6.0 5.0 - 8.0   Glucose, UA NEGATIVE NEGATIVE mg/dL   Hgb urine dipstick TRACE (A) NEGATIVE   Bilirubin Urine NEGATIVE NEGATIVE   Ketones, ur NEGATIVE NEGATIVE mg/dL   Protein, ur NEGATIVE  NEGATIVE mg/dL   Urobilinogen, UA 0.2 0.0 - 1.0 mg/dL   Nitrite NEGATIVE NEGATIVE   Leukocytes, UA SMALL (A) NEGATIVE  Protein / creatinine ratio, urine     Status: None   Collection Time: 04/27/15  5:24 PM  Result Value Ref Range   Creatinine, Urine 73.00 mg/dL   Total Protein, Urine <6 mg/dL   Protein Creatinine Ratio        0.00 - 0.15 mg/mg[Cre]  Urine microscopic-add on     Status: Abnormal   Collection Time: 04/27/15  5:24 PM  Result Value Ref Range   Squamous Epithelial / LPF FEW (A) RARE   WBC, UA 0-2 <3 WBC/hpf   RBC / HPF 0-2 <3 RBC/hpf    FHT: 110 bpm, Mod Var, -Decels, +Accels UC: Q1-24min, palpates mild to moderate   SVE:   Dilation: 1 Effacement (%): 80, 90 Station: -3 Exam by:: e. poore, rn Membranes:Intact Pitocin: None  Assessment:  IUP at 37.4wks Cat I FT  GHTN IOL GBS Negative  Plan: -Discussed POC to include this provider managing throughout the night as well as performing delivery if before 0630. -No questions or concerns -Will order ambien in case patient desires for sleep -Continue other mgmt as ordered  Deborra Phegley LYNN,MSN, CNM 04/27/2015, 9:46 PM

## 2015-04-27 NOTE — H&P (Signed)
HPI: 24 y/o G2P0010 @ [redacted]w[redacted]d estimated gestational age (as dated by LMP c/w ultrasound) presents for IOL.   no Leaking of Fluid,   no Vaginal Bleeding,   no Uterine Contractions,  + Fetal Movement.  ROS: Pt reports slight HA that she rates 2/10, no epigastric pain, no visual changes.    Pregnancy complicated by: 1) Gestational HTN  -ruled out for preeclampsia 7/27, normal labs including PC ratio 0.19  -Pt has had mild HA throughout pregnancy controlled with tylenol, recently tried Fioricet  -last NST performed 8/9- reactive 2) Size less than dates: @ [redacted]w[redacted]d: vertex/anterior/EFW: 4#11oz (49%), repeat US @ 34wk: 5#8oz (23%) 3) Nausea/vomiting in pregnancy: Zofran, Reglan and Diclegis as needed, denies recent episode of vomiting   Prenatal Transfer Tool  Maternal Diabetes: No Genetic Screening: Normal Maternal Ultrasounds/Referrals: Normal Fetal Ultrasounds or other Referrals:  None Maternal Substance Abuse:  No Significant Maternal Medications:  Meds include: Other: Reglan, Zofran, Diclegis Significant Maternal Lab Results: Lab values include: Group B Strep negative   PNL:  GBS neg, Rub Immune, Hep B neg, RPR NR, HIV neg, GC/C neg, glucola:normal Hgb 11.5 Blood type: O positive, antibody negative  OBHx: TAB x 1 PMHx:  none Meds:  PNV, Zofran, Diclegis, Reglan, Fioricet Allergy:  No Known Allergies SurgHx: none SocHx:   no Tobacco, no  EtOH, no Illicit Drugs  O: BP 136/89 mmHg  Pulse 84  Temp(Src) 98.2 F (36.8 C) (Oral)  Resp 18  Ht  (1.651 m)  Wt 80.287 kg (177 lb)  BMI 29.45 kg/m2  LMP 08/07/2014 Gen. AAOx3, NAD CV.  RRR  No murmur.  Resp. CTAB, no wheeze or crackles. Abd. Gravid,  no tenderness,  no rigidity,  no guarding Extr.  no edema B/L , no calf tenderness, no clonus, 2+ DTRs  FHT: 110 baseline, moderate variability, + accels,  no decels Toco: irregular SVE: 1/50/-3, vertex by exam   Labs: see orders  A/P:  24 y.o. G2P0010 @ [redacted]w[redacted]d EGA who presents for  IOL due to gestational HTN -FWB:  NICHD Cat I FHTs -Labor: plan for cytotec per protcol -GBS: negative -Gestational HTN: BP as above, baseline preeclampsia labs to be completed.  Myna Hidalgo, DO [phone] (pager) [phone] (office)

## 2015-04-28 ENCOUNTER — Inpatient Hospital Stay (HOSPITAL_COMMUNITY): Payer: BLUE CROSS/BLUE SHIELD | Admitting: Anesthesiology

## 2015-04-28 ENCOUNTER — Encounter (HOSPITAL_COMMUNITY): Payer: Self-pay | Admitting: Anesthesiology

## 2015-04-28 LAB — CBC
HCT: 31.2 % — ABNORMAL LOW (ref 36.0–46.0)
HEMATOCRIT: 30.7 % — AB (ref 36.0–46.0)
Hemoglobin: 10.5 g/dL — ABNORMAL LOW (ref 12.0–15.0)
Hemoglobin: 10.4 g/dL — ABNORMAL LOW (ref 12.0–15.0)
MCH: 30 pg (ref 26.0–34.0)
MCH: 30.5 pg (ref 26.0–34.0)
MCHC: 33.7 g/dL (ref 30.0–36.0)
MCHC: 33.9 g/dL (ref 30.0–36.0)
MCV: 89.1 fL (ref 78.0–100.0)
MCV: 90 fL (ref 78.0–100.0)
Platelets: 178 10*3/uL (ref 150–400)
Platelets: 194 10*3/uL (ref 150–400)
RBC: 3.5 MIL/uL — ABNORMAL LOW (ref 3.87–5.11)
RBC: 3.41 MIL/uL — ABNORMAL LOW (ref 3.87–5.11)
RDW: 13.2 % (ref 11.5–15.5)
RDW: 13.1 % (ref 11.5–15.5)
WBC: 8.1 10*3/uL (ref 4.0–10.5)
WBC: 10.5 10*3/uL (ref 4.0–10.5)

## 2015-04-28 LAB — HIV ANTIBODY (ROUTINE TESTING W REFLEX): HIV Screen 4th Generation wRfx: NONREACTIVE

## 2015-04-28 LAB — RPR: RPR Ser Ql: NONREACTIVE

## 2015-04-28 MED ORDER — LANOLIN HYDROUS EX OINT: TOPICAL_OINTMENT | CUTANEOUS | Status: DC | PRN

## 2015-04-28 MED ORDER — FENTANYL 2.5 MCG/ML BUPIVACAINE 1/10 % EPIDURAL INFUSION (WH - ANES)
14.0000 mL/h | INTRAMUSCULAR | Status: DC | PRN
  Administered 2015-04-28 (×2): 14 mL/h via EPIDURAL
  Filled 2015-04-28: qty 125

## 2015-04-28 MED ORDER — IBUPROFEN 600 MG PO TABS
600.0000 mg | ORAL_TABLET | Freq: Four times a day (QID) | ORAL | Status: DC
  Administered 2015-04-28 – 2015-04-30 (×7): 600 mg via ORAL
  Filled 2015-04-28 (×7): qty 1

## 2015-04-28 MED ORDER — OXYCODONE-ACETAMINOPHEN 5-325 MG PO TABS: 1.0000 | ORAL_TABLET | ORAL | Status: DC | PRN

## 2015-04-28 MED ORDER — ACETAMINOPHEN 325 MG PO TABS
650.0000 mg | ORAL_TABLET | ORAL | Status: DC | PRN
Start: 2015-04-28 — End: 2015-04-30
  Administered 2015-04-29: 650 mg via ORAL
  Filled 2015-04-28: qty 2

## 2015-04-28 MED ORDER — DIPHENHYDRAMINE HCL 50 MG/ML IJ SOLN: 12.5000 mg | INTRAMUSCULAR | Status: DC | PRN

## 2015-04-28 MED ORDER — WITCH HAZEL-GLYCERIN EX PADS: 1.0000 "application " | MEDICATED_PAD | CUTANEOUS | Status: DC | PRN

## 2015-04-28 MED ORDER — ONDANSETRON HCL 4 MG PO TABS: 4.0000 mg | ORAL_TABLET | ORAL | Status: DC | PRN

## 2015-04-28 MED ORDER — PRENATAL MULTIVITAMIN CH
1.0000 | ORAL_TABLET | Freq: Every day | ORAL | Status: DC
  Administered 2015-04-29 – 2015-04-30 (×2): 1 via ORAL
  Filled 2015-04-28 (×2): qty 1

## 2015-04-28 MED ORDER — ZOLPIDEM TARTRATE 5 MG PO TABS: 5.0000 mg | ORAL_TABLET | Freq: Every evening | ORAL | Status: DC | PRN

## 2015-04-28 MED ORDER — TETANUS-DIPHTH-ACELL PERTUSSIS 5-2.5-18.5 LF-MCG/0.5 IM SUSP: 0.5000 mL | Freq: Once | INTRAMUSCULAR | Status: DC

## 2015-04-28 MED ORDER — HYDROCORTISONE ACETATE 25 MG RE SUPP
25.0000 mg | Freq: Two times a day (BID) | RECTAL | Status: DC | PRN
  Filled 2015-04-28: qty 1

## 2015-04-28 MED ORDER — SIMETHICONE 80 MG PO CHEW: 80.0000 mg | CHEWABLE_TABLET | ORAL | Status: DC | PRN

## 2015-04-28 MED ORDER — DIBUCAINE 1 % RE OINT: 1.0000 "application " | TOPICAL_OINTMENT | RECTAL | Status: DC | PRN

## 2015-04-28 MED ORDER — DIPHENHYDRAMINE HCL 25 MG PO CAPS: 25.0000 mg | ORAL_CAPSULE | Freq: Four times a day (QID) | ORAL | Status: DC | PRN

## 2015-04-28 MED ORDER — EPHEDRINE 5 MG/ML INJ
10.0000 mg | INTRAVENOUS | Status: DC | PRN
  Filled 2015-04-28: qty 2

## 2015-04-28 MED ORDER — OXYCODONE-ACETAMINOPHEN 5-325 MG PO TABS: 2.0000 | ORAL_TABLET | ORAL | Status: DC | PRN

## 2015-04-28 MED ORDER — OXYTOCIN 40 UNITS IN LACTATED RINGERS INFUSION - SIMPLE MED: 62.5000 mL/h | INTRAVENOUS | Status: DC | PRN

## 2015-04-28 MED ORDER — TERBUTALINE SULFATE 1 MG/ML IJ SOLN
0.2500 mg | Freq: Once | INTRAMUSCULAR | Status: DC | PRN
  Filled 2015-04-28: qty 1

## 2015-04-28 MED ORDER — SENNOSIDES-DOCUSATE SODIUM 8.6-50 MG PO TABS
2.0000 | ORAL_TABLET | ORAL | Status: DC
  Administered 2015-04-28 – 2015-04-29 (×2): 2 via ORAL
  Filled 2015-04-28 (×2): qty 2

## 2015-04-28 MED ORDER — PHENYLEPHRINE 40 MCG/ML (10ML) SYRINGE FOR IV PUSH (FOR BLOOD PRESSURE SUPPORT)
80.0000 ug | PREFILLED_SYRINGE | INTRAVENOUS | Status: DC | PRN
  Filled 2015-04-28: qty 2
  Filled 2015-04-28: qty 20

## 2015-04-28 MED ORDER — ONDANSETRON HCL 4 MG/2ML IJ SOLN: 4.0000 mg | INTRAMUSCULAR | Status: DC | PRN

## 2015-04-28 MED ORDER — BENZOCAINE-MENTHOL 20-0.5 % EX AERO: 1.0000 "application " | INHALATION_SPRAY | CUTANEOUS | Status: DC | PRN

## 2015-04-28 MED ORDER — HYDRALAZINE HCL 20 MG/ML IJ SOLN
5.0000 mg | INTRAMUSCULAR | Status: DC | PRN
Start: 2015-04-28 — End: 2015-04-30

## 2015-04-28 MED ORDER — HYDROCORTISONE ACETATE 25 MG RE SUPP
25.0000 mg | Freq: Once | RECTAL | Status: AC
  Administered 2015-04-28: 25 mg via RECTAL
  Filled 2015-04-28: qty 1

## 2015-04-28 MED ORDER — MISOPROSTOL 200 MCG PO TABS: 800.0000 ug | ORAL_TABLET | Freq: Once | ORAL | Status: DC | PRN

## 2015-04-28 MED ORDER — LIDOCAINE HCL (PF) 1 % IJ SOLN
INTRAMUSCULAR | Status: DC | PRN
  Administered 2015-04-28 (×2): 8 mL via EPIDURAL

## 2015-04-28 MED ORDER — MAGNESIUM HYDROXIDE 400 MG/5ML PO SUSP: 30.0000 mL | ORAL | Status: DC | PRN

## 2015-04-28 MED ORDER — LABETALOL HCL 5 MG/ML IV SOLN
20.0000 mg | INTRAVENOUS | Status: DC | PRN
  Administered 2015-04-29: 20 mg via INTRAVENOUS
  Filled 2015-04-28: qty 4

## 2015-04-28 MED ORDER — OXYTOCIN 40 UNITS IN LACTATED RINGERS INFUSION - SIMPLE MED
1.0000 m[IU]/min | INTRAVENOUS | Status: DC
  Administered 2015-04-28: 1 m[IU]/min via INTRAVENOUS
  Filled 2015-04-28: qty 1000

## 2015-04-28 NOTE — Progress Notes (Signed)
Kathleen Mccall is a 24 y.o. G2P0010 at [redacted]w[redacted]d   Subjective: Pt without complaints.  Mild cramping but after AROM contraction 6-7/10.    Objective: BP 143/93 mmHg  Pulse 70  Temp(Src) 98.7 F (37.1 C) (Oral)  Resp 18  Ht  (1.651 m)  Wt 80.287 kg (177 lb)  BMI 29.45 kg/m2  SpO2 99%  LMP 08/07/2014   Total I/O In: 1350.4 [P.O.:820; I.V.:530.4] Out: 875 [Urine:875]  FHT:  FHR: 120s bpm, variability: moderate,  accelerations:  Present,  decelerations:  Present mild variable UC:   regular, every 2 minutes SVE:   Dilation: 5.5 Effacement (%): 70 Station: -2, Ballotable Exam by:: Esperanza Madrazo  AROM clear. IUPC placed posteriorly without difficulty due to lack of cervical change.  Neuro:  1-2+ DTR Narrow arch  Labs: Lab Results  Component Value Date   WBC 5.8 04/27/2015   HGB 10.0* 04/27/2015   HCT 29.2* 04/27/2015   MCV 88.5 04/27/2015   PLT 191 04/27/2015    Assessment / Plan: IOL at  37 5/7 weeks due to Gestational HTN BP normal to mildly elevated.  UOP normal.  No s/sxs of Preeclampsia.  Labor: Expect advancing dilitation s/p AROM. Preeclampsia:  See above Fetal Wellbeing:  Category II Pain Control:  Labor support without medications I/D:  n/a Anticipated MOD:  NSVD  Kreg Earhart 04/28/2015, 1:15 PM

## 2015-04-28 NOTE — Lactation Note (Signed)
This note was copied from the chart of Kathleen Suzzanne Brunkhorst. Lactation Consultation Note  Patient Name: Kathleen Mccall Date: 04/28/2015 Reason for consult: Initial assessment;Infant < 6lbs RN called reporting baby sleepy and not opening mouth wide to latch. LC to see Mom and she was pumping with DEBP, small amount of colostrum visible with pumping. Basic teaching reviewed with parents. Advised to continue to offer breast with feeding ques. If baby continues to be sleepy and not waking to latch, then Mom needs to supplement with EBM or formula since baby is small and may need calories to wake to BF. Parents agree. Lactation brochure left for review, advised of OP services and support group. Advised to call for assist as needed. Supplemental guidelines given to parents.    Materna Has patient been taught Hand Expression?: Yes Does the patient have breastfeeding experience prior to this delivery?: No  Feeding Feeding Type: Breast Fed Length of feed: 20 min  LATCH Score/Interventions Latch: Too sleepy or reluctant, no latch achieved, no sucking elicited. Intervention(s): Skin to skin;Teach feeding cues Intervention(s): Adjust position;Assist with latch;Breast massage  Audible Swallowing: A few with stimulation Intervention(s): Skin to skin;Hand expression Intervention(s): Skin to skin;Hand expression  Type of Nipple: Everted at rest and after stimulation  Comfort (Breast/Nipple): Soft / non-tender     Hold (Positioning): Assistance needed to correctly position infant at breast and maintain latch. Intervention(s): Breastfeeding basics reviewed;Support Pillows;Position options;Skin to skin  LATCH Score: 7  Lactation Tools Discussed/Used Tools: Pump Breast pump type: Double-Electric Breast Pump WIC Program: Yes Initiated by:: RN Date initiated:: 04/28/15   Consult Status Consult Status: Follow-up Date: 04/29/15 Follow-up type: In-patient    Alfred Levins 04/28/2015, 9:53 PM

## 2015-04-28 NOTE — Progress Notes (Signed)
Kathleen Mccall [id number]  Subjective: Strip and Chart Reviewed.  Objective:  Filed Vitals:   04/27/15 2126 04/27/15 2140 04/27/15 2145 04/27/15 2219  BP: 144/95   140/84  Pulse: 66 67 55 62  Temp:      TempSrc:      Resp: 16   16  Height:      Weight:      SpO2:  99% 99%     FHR: 105 bpm, Mod Var, -Decels, +Accels UC: Q1-12min  Assessment: IUP at [redacted]w[redacted]d Cat I FT Foley Bulb GHTN  Plan: -Continue mgmt as ordered  Sabas Sous, CNM 04/28/2015 12:24 AM

## 2015-04-28 NOTE — Progress Notes (Signed)
Kathleen Mccall is a 24 y.o. G2P0010 at [redacted]w[redacted]d  Subjective: Pt without complaints.  Mild discomfort with contractions. No LOF.  Active fetus.  Denies headaches, visual changes, RUQ pain.  Objective: BP 147/94 mmHg  Pulse 72  Temp(Src) 98.3 F (36.8 C) (Oral)  Resp 18  Ht  (1.651 m)  Wt 80.287 kg (177 lb)  BMI 29.45 kg/m2  SpO2 99%  LMP 08/07/2014     UOP not measured.  FHT:  FHR: 110s bpm, variability: moderate,  accelerations:  Present,  decelerations:  Absent UC:   regular, every 2-4 minutes SVE:   Dilation: 5 Effacement (%): 70, 80 Station: -2 Exam by:: e. poore, rn My exam 5/70/-2, ballotable Neuro: DTR 2+ Abd:  No RUQ pain.  Labs: Lab Results  Component Value Date   WBC 5.8 04/27/2015   HGB 10.0* 04/27/2015   HCT 29.2* 04/27/2015   MCV 88.5 04/27/2015   PLT 191 04/27/2015   Labs reviewed normal.  Assessment / Plan: Induction of labor due to gestational hypertension,  progressing well on pitocin  GHTN-Bps normal to mildly elevated.  Labs have been stable.  Monitor UOP, Strict I/Os.  Labor: Progressing normally Preeclampsia:  No s/sxs of Preeclampsia Fetal Wellbeing:  Category I Pain Control:  Labor support without medications I/D:  n/a Anticipated MOD:  NSVD  Kathleen Mccall 04/28/2015, 7:40 AM

## 2015-04-28 NOTE — Anesthesia Procedure Notes (Signed)
Epidural Patient location during procedure: OB Start time: 04/28/2015 3:00 PM End time: 04/28/2015 3:04 PM  Staffing Anesthesiologist: Leilani Able Performed by: anesthesiologist   Preanesthetic Checklist Completed: patient identified, surgical consent, pre-op evaluation, timeout performed, IV checked, risks and benefits discussed and monitors and equipment checked  Epidural Patient position: sitting Prep: site prepped and draped and DuraPrep Patient monitoring: continuous pulse ox and blood pressure Approach: midline Location: L3-L4 Injection technique: LOR air  Needle:  Needle type: Tuohy  Needle gauge: 17 G Needle length: 9 cm and 9 Needle insertion depth: 6 cm Catheter type: closed end flexible Catheter size: 19 Gauge Catheter at skin depth: 11 cm Test dose: negative and Other  Assessment Sensory level: T10 Events: blood not aspirated, injection not painful, no injection resistance, negative IV test and no paresthesia  Additional Notes Reason for block:procedure for pain

## 2015-04-28 NOTE — Anesthesia Preprocedure Evaluation (Signed)
Anesthesia Evaluation  Patient identified by MRN, date of birth, ID band Patient awake    Reviewed: Allergy & Precautions, H&P , NPO status , Patient's Chart, lab work & pertinent test results  Airway Mallampati: II  TM Distance: >3 FB Neck ROM: full    Dental no notable dental hx.    Pulmonary neg pulmonary ROS,    Pulmonary exam normal       Cardiovascular Normal cardiovascular exam    Neuro/Psych negative neurological ROS  negative psych ROS   GI/Hepatic negative GI ROS, Neg liver ROS,   Endo/Other  negative endocrine ROS  Renal/GU negative Renal ROS     Musculoskeletal   Abdominal Normal abdominal exam  (+)   Peds  Hematology   Anesthesia Other Findings   Reproductive/Obstetrics (+) Pregnancy                             Anesthesia Physical Anesthesia Plan  ASA: II  Anesthesia Plan: Epidural   Post-op Pain Management:    Induction:   Airway Management Planned:   Additional Equipment:   Intra-op Plan:   Post-operative Plan:   Informed Consent: I have reviewed the patients History and Physical, chart, labs and discussed the procedure including the risks, benefits and alternatives for the proposed anesthesia with the patient or authorized representative who has indicated his/her understanding and acceptance.     Plan Discussed with:   Anesthesia Plan Comments:         Anesthesia Quick Evaluation

## 2015-04-29 LAB — CBC
HCT: 28.2 % — ABNORMAL LOW (ref 36.0–46.0)
Hemoglobin: 9.5 g/dL — ABNORMAL LOW (ref 12.0–15.0)
MCH: 30.2 pg (ref 26.0–34.0)
MCHC: 33.7 g/dL (ref 30.0–36.0)
MCV: 89.5 fL (ref 78.0–100.0)
Platelets: 204 10*3/uL (ref 150–400)
RBC: 3.15 MIL/uL — ABNORMAL LOW (ref 3.87–5.11)
RDW: 13.1 % (ref 11.5–15.5)
WBC: 15 10*3/uL — AB (ref 4.0–10.5)

## 2015-04-29 LAB — COMPREHENSIVE METABOLIC PANEL
ALT: 14 U/L (ref 14–54)
ANION GAP: 7 (ref 5–15)
AST: 29 U/L (ref 15–41)
Albumin: 2.8 g/dL — ABNORMAL LOW (ref 3.5–5.0)
Alkaline Phosphatase: 135 U/L — ABNORMAL HIGH (ref 38–126)
CALCIUM: 8.6 mg/dL — AB (ref 8.9–10.3)
CO2: 25 mmol/L (ref 22–32)
Chloride: 106 mmol/L (ref 101–111)
Creatinine, Ser: 0.74 mg/dL (ref 0.44–1.00)
GFR calc Af Amer: >60 mL/min (ref >60)
GFR calc non Af Amer: >60 mL/min (ref >60)
GLUCOSE: 82 mg/dL (ref 65–99)
Potassium: 4.4 mmol/L (ref 3.5–5.1)
Sodium: 138 mmol/L (ref 135–145)
TOTAL PROTEIN: 6 g/dL — AB (ref 6.5–8.1)
Total Bilirubin: 0.7 mg/dL (ref 0.3–1.2)

## 2015-04-29 NOTE — Lactation Note (Signed)
This note was copied from the chart of Kathleen Mccall. Lactation Consultation Note  Follow up visit.  Baby is now 10 hours old.  Mom states baby latches with feeding cues and then she post pumps with DEBP. She is obtaining drops of colostrum.  Reassured and discussed milk coming to volume.  Instructed to increase formula supplementation to 10 mls every 3 hours.  Encouraged to call with concerns/assist prn.  Patient Name: Kathleen Casia Corti [id number] Date: 04/29/2015     Maternal Data    Feeding    LATCH Score/Interventions                      Lactation Tools Discussed/Used     Consult Status      Huston Foley 04/29/2015, 12:39 PM

## 2015-04-29 NOTE — Anesthesia Postprocedure Evaluation (Signed)
  Anesthesia Post-op Note  Patient: Medical illustrator  Procedure(s) Performed: * No procedures listed *  Patient Location: Mother/Baby  Anesthesia Type:Epidural  Level of Consciousness: awake and alert   Airway and Oxygen Therapy: Patient Spontanous Breathing  Post-op Pain: mild  Post-op Assessment: Post-op Vital signs reviewed, Patient's Cardiovascular Status Stable, Respiratory Function Stable, No signs of Nausea or vomiting, Pain level controlled, No headache, Spinal receding and Patient able to bend at knees              Post-op Vital Signs: Reviewed  Last Vitals:  Filed Vitals:   04/29/15 0738  BP: 141/80  Pulse: 67  Temp: 36.8 C  Resp: 18    Complications: No apparent anesthesia complications

## 2015-04-29 NOTE — Progress Notes (Signed)
Subjective: Postpartum Day 1: Vaginal delivery, right and left labial lacerations Patient up ad lib, reports no syncope or dizziness.   Denies HA, visual sx, or epigastric pain.  Reports hemorrhoid stable, no increased pain. Feeding:  Breastfeeding, going slowly.  Baby 5 lbs. Contraceptive plan:  Undecided  Hx THC use--meconium testing planned for baby.  Objective: Vital signs in last 24 hours: Temp:  [97.6 F (36.4 C)-98.8 F (37.1 C)] 98.3 F (36.8 C) (08/13 0738) Pulse Rate:  [52-99] 67 (08/13 0738) Resp:  [18-20] 18 (08/13 0738) BP: (137-173)/(68-112) 141/80 mmHg (08/13 0738) SpO2:  [97 %-100 %] 97 % (08/12 1531)   Filed Vitals:   04/28/15 1816 04/28/15 1940 04/28/15 2355 04/29/15 0738  BP: 154/91 138/80 137/81 141/80  Pulse: 78 65 81 67  Temp:  98.8 F (37.1 C) 97.6 F (36.4 C) 98.3 F (36.8 C)  TempSrc:  Oral Oral Oral  Resp: Height:      Weight:      SpO2:        Physical Exam:  General: alert Lochia: appropriate Uterine Fundus: firm Perineum: healing well DVT Evaluation: No evidence of DVT seen on physical exam. Negative Homan's sign.   CBC Latest Ref Rng 04/29/2015 04/28/2015 04/28/2015  WBC 4.0 - 10.5 K/uL 15.0(H) 10.5 8.1  Hemoglobin 12.0 - 15.0 g/dL 1.6(X) 10.4(L) 10.5(L)  Hematocrit 36.0 - 46.0 % 28.2(L) 30.7(L) 31.2(L)  Platelets 150 - 400 K/uL 204 194 178   CMP Latest Ref Rng 04/29/2015 04/27/2015 04/14/2015  Glucose 65 - 99 mg/dL 82 98 80  BUN 6 - 20 mg/dL <0(R) 7 <6(E)  Creatinine 0.44 - 1.00 mg/dL 4.54 0.98 1.19  Sodium 135 - 145 mmol/L 138 134(L) 134(L)  Potassium 3.5 - 5.1 mmol/L 4.4 3.7 3.5  Chloride 101 - 111 mmol/L 106 109 109  CO2 22 - 32 mmol/L 25 21(L) 20(L)  Calcium 8.9 - 10.3 mg/dL 1.4(N) 8.2(N) 8.1(L)  Total Protein 6.5 - 8.1 g/dL 6.0(L) 5.9(L) 6.2(L)  Total Bilirubin 0.3 - 1.2 mg/dL 0.7 0.8 0.8  Alkaline Phos 38 - 126 U/L 135(H) 161(H) 164(H)  AST 15 - 41 U/L ALT 14 - 54 U/L 14 16 11(L)      Assessment/Plan: Status post vaginal delivery day 1. Gestational hypertension--no meds Stable Continue current care. Plan for discharge tomorrow  LC support for feedings. SW consult already ordered.    Nyra Capes 04/29/2015, 11:27 AM

## 2015-04-29 NOTE — Progress Notes (Signed)
S: Call received from RN re: BP 164/109 & H/A. Pt denies visual changes, epigastric pain or difficulty breathing.  O: BPs 162/88 at 1916 and 164/109 at 2117  A: 24 yo G2 now P1011, s/p SVD, postpartum day 1 Gestational HTN Chronic H/A  P: Labetalol 20 mg IV given Consulted Dr. Sallye Ober - recheck BP in 20 min -- if remain above parameters despite IV dose, will begin Labetalol 100 mg bid.  Sherre Scarlet, CNM 04/29/15, 9:34 PM  ADDENDUM: BP 147/92 at 2150 BP 136/79 at 2234  Will hold off on Labetalol po at this time  Sherre Scarlet, CNM 04/29/15, 11:22 PM

## 2015-04-30 MED ORDER — IBUPROFEN 600 MG PO TABS: 600.0000 mg | ORAL_TABLET | Freq: Four times a day (QID) | ORAL | Status: DC | PRN

## 2015-04-30 MED ORDER — LABETALOL HCL 100 MG PO TABS: 100.0000 mg | ORAL_TABLET | Freq: Two times a day (BID) | ORAL | Status: DC

## 2015-04-30 MED ORDER — LABETALOL HCL 100 MG PO TABS
100.0000 mg | ORAL_TABLET | Freq: Two times a day (BID) | ORAL | Status: DC
  Administered 2015-04-30: 100 mg via ORAL
  Filled 2015-04-30: qty 1

## 2015-04-30 NOTE — Discharge Summary (Signed)
  Vaginal Delivery Discharge Summary  Kathleen Mccall  DOB:    [birth year] MRN:    [id number] CSN:    [id number]  Date of admission:                  04/27/15  Date of discharge:                   04/30/15  Procedures this admission:   SVB, repair of right and left labial lacerations  Date of Delivery: 04/28/15  Newborn Data:  Live born female  Birth Weight: 5 lb 0.1 oz (2272 g) APGAR: 9, 9  Baby remains a patient at the time of mother's d/c due to IUGR and weight loss.  Circumcision Plan: Outpatient at [facility]  History of Present Illness:  [name] is a 24 y.o. female, G2P1011, who presents at [redacted]w[redacted]d weeks gestation. The patient has been followed at [facility]. She was admitted for induction of labor due to gestational hypertension. Her pregnancy has been complicated by:  Patient Active Problem List   Diagnosis Date Noted  . Vaginal delivery 04/29/2015  . Gestational hypertension w/o significant proteinuria in 3rd trimester 04/27/2015     Hospital Course:   Admitting Dx:  IUP at 37 4/7 weeks, gestational hypertension GBS Status:  Negative Delivering Clinician: Dr. Dion Body Lacerations/MLE: Right and left labial lacerations Complications: Mild BP elevations continued pp.  Begun on Labetalol 100 mg po BID on the day of d/c.  Intrapartum Procedures: spontaneous vaginal delivery Postpartum Procedures: none Complications-Operative and Postpartum: Bilateral labial lacerations laceration  Discharge Diagnoses: Term Pregnancy-delivered, gestational hypertension, IUGR  Feeding:  breast and bottle  Contraception:  Patient considering Nexplanon or OCPs, but is currrently undecided.  Hemoglobin Results:  CBC Latest Ref Rng 04/29/2015 04/28/2015 04/28/2015  WBC 4.0 - 10.5 K/uL 15.0(H) 10.5 8.1  Hemoglobin 12.0 - 15.0 g/dL 7.8(I) 10.4(L) 10.5(L)  Hematocrit 36.0 - 46.0 % 28.2(L) 30.7(L) 31.2(L)  Platelets 150 - 400 K/uL 204 194 178    Discharge Physical Exam:    General: alert Lochia: appropriate Uterine Fundus: firm Incision: healing well DVT Evaluation: No s/s DVT, 1+ edema, DTR 1+.   Discharge Information:  Activity:           pelvic rest Diet:                routine Medications: Ibuprofen and Labetalol 100 mg po BID Condition:      stable Instructions:  Routine pp instructions   Discharge to: home  Follow-up Information    Follow up with [facility]. Schedule an appointment as soon as possible for a visit in 3 days.   Specialty:  Obstetrics and Gynecology   Why:  Call [facility] to make an appointment this week for blood pressure check. Call [facility] at [phone] to schedule circumcision.   Contact information:   [address] [place] [address] [phone]        Nigel Bridgeman CNM 04/30/2015 12:40 PM

## 2015-04-30 NOTE — Discharge Instructions (Signed)
Postpartum Care After Vaginal Delivery °After you deliver your newborn (postpartum period), the usual stay in the hospital is 24-72 hours. If there were problems with your labor or delivery, or if you have other medical problems, you might be in the hospital longer.  °While you are in the hospital, you will receive help and instructions on how to care for yourself and your newborn during the postpartum period.  °While you are in the hospital: °· Be sure to tell your nurses if you have pain or discomfort, as well as where you feel the pain and what makes the pain worse. °· If you had an incision made near your vagina (episiotomy) or if you had some tearing during delivery, the nurses may put ice packs on your episiotomy or tear. The ice packs may help to reduce the pain and swelling. °· If you are breastfeeding, you may feel uncomfortable contractions of your uterus for a couple of weeks. This is normal. The contractions help your uterus get back to normal size. °· It is normal to have some bleeding after delivery. °¨ For the first 1-3 days after delivery, the flow is red and the amount may be similar to a period. °¨ It is common for the flow to start and stop. °¨ In the first few days, you may pass some small clots. Let your nurses know if you begin to pass large clots or your flow increases. °¨ Do not  flush blood clots down the toilet before having the nurse look at them. °¨ During the next 3-10 days after delivery, your flow should become more watery and pink or brown-tinged in color. °¨ Ten to fourteen days after delivery, your flow should be a small amount of yellowish-white discharge. °¨ The amount of your flow will decrease over the first few weeks after delivery. Your flow may stop in 6-8 weeks. Most women have had their flow stop by 12 weeks after delivery. °· You should change your sanitary pads frequently. °· Wash your hands thoroughly with soap and water for at least 20 seconds after changing pads, using  the toilet, or before holding or feeding your newborn. °· You should feel like you need to empty your bladder within the first 6-8 hours after delivery. °· In case you become weak, lightheaded, or faint, call your nurse before you get out of bed for the first time and before you take a shower for the first time. °· Within the first few days after delivery, your breasts may begin to feel tender and full. This is called engorgement. Breast tenderness usually goes away within 48-72 hours after engorgement occurs. You may also notice milk leaking from your breasts. If you are not breastfeeding, do not stimulate your breasts. Breast stimulation can make your breasts produce more milk. °· Spending as much time as possible with your newborn is very important. During this time, you and your newborn can feel close and get to know each other. Having your newborn stay in your room (rooming in) will help to strengthen the bond with your newborn.  It will give you time to get to know your newborn and become comfortable caring for your newborn. °· Your hormones change after delivery. Sometimes the hormone changes can temporarily cause you to feel sad or tearful. These feelings should not last more than a few days. If these feelings last longer than that, you should talk to your caregiver. °· If desired, talk to your caregiver about methods of family planning or contraception. °·   Talk to your caregiver about immunizations. Your caregiver may want you to have the following immunizations before leaving the hospital: °· Tetanus, diphtheria, and pertussis (Tdap) or tetanus and diphtheria (Td) immunization. It is very important that you and your family (including grandparents) or others caring for your newborn are up-to-date with the Tdap or Td immunizations. The Tdap or Td immunization can help protect your newborn from getting ill. °· Rubella immunization. °· Varicella (chickenpox) immunization. °· Influenza immunization. You should  receive this annual immunization if you did not receive the immunization during your pregnancy. °Document Released: 06/30/2007 Document Revised: 05/27/2012 Document Reviewed: 04/29/2012 °ExitCare® Patient Information ©2015 [employer]. This information is not intended to replace advice given to you by your health care provider. Make sure you discuss any questions you have with your health care provider. ° ° °Iron-Rich Diet °An iron-rich diet contains foods that are good sources of iron. Iron is an important mineral that helps your body produce hemoglobin. Hemoglobin is a protein in red blood cells that carries oxygen to the body's tissues. Sometimes, the iron level in your blood can be low. This may be caused by: °· A lack of iron in your diet. °· Blood loss. °· Times of growth, such as during pregnancy or during a child's growth and development. °Low levels of iron can cause a decrease in the number of red blood cells. This can result in iron deficiency anemia. Iron deficiency anemia symptoms include: °· Tiredness. °· Weakness. °· Irritability. °· Increased chance of infection. °Here are some recommendations for daily iron intake: °· Males older than 25 years of age need 8 mg of iron per day. °· Women ages 19 to 50 need 18 mg of iron per day. °· Pregnant women need 27 mg of iron per day, and women who are over 19 years of age and breastfeeding need 9 mg of iron per day. °· Women over the age of 50 need 8 mg of iron per day. °SOURCES OF IRON °There are 2 types of iron that are found in food: heme iron and nonheme iron. Heme iron is absorbed by the body better than nonheme iron. Heme iron is found in meat, poultry, and fish. Nonheme iron is found in grains, beans, and vegetables. °Heme Iron Sources °Food / Iron (mg) °· Chicken liver, 3 oz (85 g)/ 10 mg °· Beef liver, 3 oz (85 g)/ 5.5 mg °· Oysters, 3 oz (85 g)/ 8 mg °· Beef, 3 oz (85 g)/ 2 to 3 mg °· Shrimp, 3 oz (85 g)/ 2.8 mg °· Turkey, 3 oz (85 g)/ 2  mg °· Chicken, 3 oz (85 g) / 1 mg °· Fish (tuna, halibut), 3 oz (85 g)/ 1 mg °· Pork, 3 oz (85 g)/ 0.9 mg °Nonheme Iron Sources °Food / Iron (mg) °· Ready-to-eat breakfast cereal, iron-fortified / 3.9 to 7 mg °· Tofu, ½ cup / 3.4 mg °· Kidney beans, ½ cup / 2.6 mg °· Baked potato with skin / 2.7 mg °· Asparagus, ½ cup / 2.2 mg °· Avocado / 2 mg °· Dried peaches, ½ cup / 1.6 mg °· Raisins, ½ cup / 1.5 mg °· Soy milk, 1 cup / 1.5 mg °· Whole-wheat bread, 1 slice / 1.2 mg °· Spinach, 1 cup / 0.8 mg °· Broccoli, ½ cup / 0.6 mg °IRON ABSORPTION °Certain foods can decrease the body's absorption of iron. Try to avoid these foods and beverages while eating meals with iron-containing foods: °· Coffee. °· Tea. °·   Fiber. °· Soy. °Foods containing vitamin C can help increase the amount of iron your body absorbs from iron sources, especially from nonheme sources. Eat foods with vitamin C along with iron-containing foods to increase your iron absorption. Foods that are high in vitamin C include many fruits and vegetables. Some good sources are: °· Fresh orange juice. °· Oranges. °· Strawberries. °· Mangoes. °· Grapefruit. °· Red bell peppers. °· Green bell peppers. °· Broccoli. °· Potatoes with skin. °· Tomato juice. °Document Released: 04/16/2005 Document Revised: 11/25/2011 Document Reviewed: 02/21/2011 °ExitCare® Patient Information ©2015 [employer]. This information is not intended to replace advice given to you by your health care provider. Make sure you discuss any questions you have with your health care provider. ° °

## 2015-05-02 ENCOUNTER — Encounter (HOSPITAL_COMMUNITY): Payer: Self-pay | Admitting: *Deleted

## 2015-05-02 ENCOUNTER — Inpatient Hospital Stay (HOSPITAL_COMMUNITY)
Admission: AD | Admit: 2015-05-02 | Discharge: 2015-05-04 | DRG: 776 | Disposition: A | Discharge status: Home / Self Care | Payer: BLUE CROSS/BLUE SHIELD | Source: Ambulatory Visit | Attending: Obstetrics & Gynecology | Admitting: Obstetrics & Gynecology

## 2015-05-02 DIAGNOSIS — O1490 Unspecified pre-eclampsia, unspecified trimester: Secondary | ICD-10-CM

## 2015-05-02 DIAGNOSIS — I158 Other secondary hypertension: Secondary | ICD-10-CM | POA: Diagnosis present

## 2015-05-02 DIAGNOSIS — O133 Gestational [pregnancy-induced] hypertension without significant proteinuria, third trimester: Secondary | ICD-10-CM | POA: Diagnosis present

## 2015-05-02 DIAGNOSIS — O1415 Severe pre-eclampsia, complicating the puerperium: Secondary | ICD-10-CM | POA: Diagnosis present

## 2015-05-02 DIAGNOSIS — O9089 Other complications of the puerperium, not elsewhere classified: Principal | ICD-10-CM | POA: Diagnosis present

## 2015-05-02 DIAGNOSIS — IMO0002 Reserved for concepts with insufficient information to code with codable children: Secondary | ICD-10-CM | POA: Diagnosis present

## 2015-05-02 LAB — COMPREHENSIVE METABOLIC PANEL
ALT: 16 U/L (ref 14–54)
AST: 27 U/L (ref 15–41)
Albumin: 3.4 g/dL — ABNORMAL LOW (ref 3.5–5.0)
Alkaline Phosphatase: 129 U/L — ABNORMAL HIGH (ref 38–126)
Anion gap: 8 (ref 5–15)
BUN: 7 mg/dL (ref 6–20)
CHLORIDE: 106 mmol/L (ref 101–111)
CO2: 23 mmol/L (ref 22–32)
Calcium: 8.8 mg/dL — ABNORMAL LOW (ref 8.9–10.3)
Creatinine, Ser: 0.73 mg/dL (ref 0.44–1.00)
Glucose, Bld: 74 mg/dL (ref 65–99)
POTASSIUM: 4.3 mmol/L (ref 3.5–5.1)
Sodium: 137 mmol/L (ref 135–145)
Total Bilirubin: 0.8 mg/dL (ref 0.3–1.2)
Total Protein: 6.8 g/dL (ref 6.5–8.1)

## 2015-05-02 LAB — PROTEIN / CREATININE RATIO, URINE
Creatinine, Urine: 52 mg/dL
PROTEIN CREATININE RATIO: 0.15 mg/mg{creat} (ref 0.00–0.15)
TOTAL PROTEIN, URINE: 8 mg/dL

## 2015-05-02 LAB — CBC WITH DIFFERENTIAL/PLATELET
BASOS ABS: 0 10*3/uL (ref 0.0–0.1)
Basophils Relative: 0 % (ref 0–1)
EOS PCT: 3 % (ref 0–5)
Eosinophils Absolute: 0.1 10*3/uL (ref 0.0–0.7)
HCT: 31.1 % — ABNORMAL LOW (ref 36.0–46.0)
Hemoglobin: 10.4 g/dL — ABNORMAL LOW (ref 12.0–15.0)
LYMPHS PCT: 21 % (ref 12–46)
Lymphs Abs: 1 10*3/uL (ref 0.7–4.0)
MCH: 30.1 pg (ref 26.0–34.0)
MCHC: 33.4 g/dL (ref 30.0–36.0)
MCV: 90.1 fL (ref 78.0–100.0)
MONO ABS: 0.5 10*3/uL (ref 0.1–1.0)
Monocytes Relative: 10 % (ref 3–12)
Neutro Abs: 3.3 10*3/uL (ref 1.7–7.7)
Neutrophils Relative %: 67 % (ref 43–77)
PLATELETS: 263 10*3/uL (ref 150–400)
RBC: 3.45 MIL/uL — ABNORMAL LOW (ref 3.87–5.11)
RDW: 13.1 % (ref 11.5–15.5)
WBC: 5 10*3/uL (ref 4.0–10.5)

## 2015-05-02 LAB — URINE MICROSCOPIC-ADD ON

## 2015-05-02 LAB — URINALYSIS, ROUTINE W REFLEX MICROSCOPIC
Bilirubin Urine: NEGATIVE
Glucose, UA: NEGATIVE mg/dL
KETONES UR: NEGATIVE mg/dL
Nitrite: NEGATIVE
PROTEIN: NEGATIVE mg/dL
Specific Gravity, Urine: 1.01 (ref 1.005–1.030)
Urobilinogen, UA: 0.2 mg/dL (ref 0.0–1.0)
pH: 7 (ref 5.0–8.0)

## 2015-05-02 LAB — URIC ACID: URIC ACID, SERUM: 4.5 mg/dL (ref 2.3–6.6)

## 2015-05-02 MED ORDER — ZOLPIDEM TARTRATE 5 MG PO TABS: 5.0000 mg | ORAL_TABLET | Freq: Every evening | ORAL | Status: DC | PRN

## 2015-05-02 MED ORDER — LABETALOL HCL 5 MG/ML IV SOLN
20.0000 mg | Freq: Once | INTRAVENOUS | Status: AC
  Administered 2015-05-02: 20 mg via INTRAVENOUS
  Filled 2015-05-02: qty 4

## 2015-05-02 MED ORDER — LACTATED RINGERS IV SOLN
INTRAVENOUS | Status: DC
  Administered 2015-05-02 – 2015-05-03 (×3): via INTRAVENOUS

## 2015-05-02 MED ORDER — LACTATED RINGERS IV SOLN
2.0000 g/h | INTRAVENOUS | Status: AC
  Administered 2015-05-02 – 2015-05-03 (×2): 2 g/h via INTRAVENOUS
  Filled 2015-05-02 (×2): qty 80

## 2015-05-02 MED ORDER — CALCIUM CARBONATE ANTACID 500 MG PO CHEW
2.0000 | CHEWABLE_TABLET | ORAL | Status: DC | PRN
  Filled 2015-05-02: qty 2

## 2015-05-02 MED ORDER — PRENATAL MULTIVITAMIN CH
1.0000 | ORAL_TABLET | Freq: Every day | ORAL | Status: DC
  Administered 2015-05-03 – 2015-05-04 (×2): 1 via ORAL
  Filled 2015-05-02 (×2): qty 1

## 2015-05-02 MED ORDER — LABETALOL HCL 5 MG/ML IV SOLN
20.0000 mg | INTRAVENOUS | Status: DC | PRN
  Administered 2015-05-02: 80 mg via INTRAVENOUS
  Administered 2015-05-02: 40 mg via INTRAVENOUS
  Filled 2015-05-02: qty 16
  Filled 2015-05-02: qty 8

## 2015-05-02 MED ORDER — MAGNESIUM SULFATE BOLUS VIA INFUSION
4.0000 g | Freq: Once | INTRAVENOUS | Status: AC
  Administered 2015-05-02: 4 g via INTRAVENOUS
  Filled 2015-05-02: qty 500

## 2015-05-02 MED ORDER — ACETAMINOPHEN 325 MG PO TABS: 650.0000 mg | ORAL_TABLET | ORAL | Status: DC | PRN

## 2015-05-02 MED ORDER — DOCUSATE SODIUM 100 MG PO CAPS
100.0000 mg | ORAL_CAPSULE | Freq: Every day | ORAL | Status: DC
  Administered 2015-05-02 – 2015-05-03 (×2): 100 mg via ORAL
  Filled 2015-05-02 (×4): qty 1

## 2015-05-02 MED ORDER — LABETALOL HCL 100 MG PO TABS
200.0000 mg | ORAL_TABLET | Freq: Two times a day (BID) | ORAL | Status: DC
  Administered 2015-05-02 – 2015-05-03 (×3): 200 mg via ORAL
  Filled 2015-05-02 (×3): qty 2

## 2015-05-02 MED ORDER — HYDRALAZINE HCL 20 MG/ML IJ SOLN
10.0000 mg | Freq: Once | INTRAMUSCULAR | Status: AC | PRN
  Administered 2015-05-02: 10 mg via INTRAVENOUS
  Filled 2015-05-02: qty 1

## 2015-05-02 NOTE — Progress Notes (Signed)
To antenatal via W/C with baby and family. Condition stable.

## 2015-05-02 NOTE — MAU Provider Note (Signed)
Chief Complaint: Hypertension   First Provider Initiated Contact with Patient 05/02/15 1113      SUBJECTIVE HPI: Kathleen Mccall is a 24 y.o. G2P1011 who presents to maternity admissions on PPD # 4 following NSVD sent from the office for elevated BP.  She reports h/a off and on x several weeks, dull and frontal, unchanged today. The h/a is not relieved by Tylenol.  She had induction of labor for her elevated BP in pregnancy.  She denies visual disturbances or epigastric pain.  She reports light lochia, denies abdominal pain, vaginal itching/burning, urinary symptoms, h/a, dizziness, n/v, or fever/chills.     Headache  This is a recurrent problem. The current episode started 1 to 4 weeks ago. The problem occurs constantly. The problem has been waxing and waning. The pain is located in the frontal region. The pain does not radiate. The pain quality is similar to prior headaches. The quality of the pain is described as dull. The pain is moderate. Pertinent negatives include no dizziness, fever, nausea, photophobia, sinus pressure, visual change, vomiting or weakness. She has tried acetaminophen and NSAIDs for the symptoms. The treatment provided no relief. Her past medical history is significant for hypertension.    Past Medical History  Diagnosis Date  . Abortion on demand   . Anemia    Past Surgical History  Procedure Laterality Date  . Tonsillectomy and adenoidectomy    . Tonsillectomy    . Induced abortion     Social History   Social History  . Marital Status: Single    Spouse Name: N/A  . Number of Children: N/A  . Years of Education: N/A   Occupational History  . Not on file.   Social History Main Topics  . Smoking status: Never Smoker   . Smokeless tobacco: Never Used  . Alcohol Use: No     Comment: occaisionally  . Drug Use: No  . Sexual Activity: Yes     Comment: Implanon 2010   Other Topics Concern  . Not on file   Social History Narrative   No current  facility-administered medications on file prior to encounter.   Current Outpatient Prescriptions on File Prior to Encounter  Medication Sig Dispense Refill  . labetalol (NORMODYNE) 100 MG tablet Take 1 tablet (100 mg total) by mouth 2 (two) times daily. 60 tablet 1  . Prenatal Vit-Fe Fumarate-FA (PRENATAL MULTIVITAMIN) TABS tablet Take 1 tablet by mouth daily at 12 noon.    Marland Kitchen ibuprofen (ADVIL,MOTRIN) 600 MG tablet Take 1 tablet (600 mg total) by mouth every 6 (six) hours as needed. (Patient not taking: Reported on 05/02/2015) 30 tablet 2  . metoCLOPramide (REGLAN) 10 MG tablet Take 1 tablet (10 mg total) by mouth 3 (three) times daily before meals. (Patient not taking: Reported on 05/02/2015) 90 tablet 1  . ondansetron (ZOFRAN ODT) 4 MG disintegrating tablet Take 1 tablet (4 mg total) by mouth every 8 (eight) hours as needed for nausea or vomiting. (Patient not taking: Reported on 05/02/2015) 20 tablet 0   No Known Allergies  ROS:  Review of Systems  Constitutional: Negative for fever, chills and fatigue.  HENT: Negative for sinus pressure.   Eyes: Negative for photophobia.  Gastrointestinal: Negative for nausea, vomiting, diarrhea and constipation.  Genitourinary: Negative for dysuria, frequency, flank pain, vaginal bleeding, vaginal discharge, difficulty urinating, vaginal pain and pelvic pain.  Neurological: Positive for headaches. Negative for dizziness and weakness.  Psychiatric/Behavioral: Negative.      I have reviewed patient's  Past Medical Hx, Surgical Hx, Family Hx, Social Hx, medications and allergies.   Physical Exam   Patient Vitals for the past 24 hrs:  BP Temp Temp src Pulse Resp  05/02/15 1242 (!) 177/104 mmHg - - (!) 59 -  05/02/15 1231 (!) 175/104 mmHg - - 72 -  05/02/15 1223 (!) 185/104 mmHg - - 69 -  05/02/15 1211 (!) 198/106 mmHg - - 64 -  05/02/15 1201 (!) 192/101 mmHg - - (!) 56 -  05/02/15 1151 (!) 176/106 mmHg - - 65 -  05/02/15 1143 194/95 mmHg - - (!) 54 -   05/02/15 1140 191/99 mmHg - - (!) 55 -  05/02/15 1121 (!) 196/104 mmHg - - (!) 57 -  05/02/15 1112 (!) 197/102 mmHg - - (!) 56 16  05/02/15 1101 200/100 mmHg - - (!) 55 -  05/02/15 1059 (!) 196/104 mmHg - - (!) 57 -  05/02/15 1039 (!) 186/103 mmHg - - (!) 56 -  05/02/15 1025 (!) 184/101 mmHg 98.4 F (36.9 C) Oral (!) 55 16   Constitutional: Well-developed, well-nourished female in no acute distress.  HEART: normal rate, heart sounds, regular rhythm RESP: normal effort, lung sounds clear and equal bilaterally GI: Abd soft, non-tender, gravid appropriate for gestational age. Pos BS x 4 MS: Extremities nontender, no edema, normal ROM Neurologic: Alert and oriented x 4.  GU: Neg CVAT.   LAB RESULTS No results found for this or any previous visit (from the past 24 hour(s)).  CBC, CMP, uric acid, LDH, and protein creatinine ratio pending  --/--/O POS, O POS (08/11 1235)   MDM: Pt came from office with verbal orders for preeclampsia labs.  With severe range BPs upon arrival, preeclampsia protocol started and IV labetalol given per protocol.  No significant change in BP after medications.  Dr Charlotta Newton called and pt admitted for magnesium sulfate. Pt stable at time of admission.  ASSESSMENT 1. Preeclampsia in postpartum period, unspecified trimester     PLAN Admit to ICU for magnesium sulfate Preeclampsia protocol orders to continue     Medication List    ASK your doctor about these medications        ibuprofen 600 MG tablet  Commonly known as:  ADVIL,MOTRIN  Take 1 tablet (600 mg total) by mouth every 6 (six) hours as needed.     labetalol 100 MG tablet  Commonly known as:  NORMODYNE  Take 1 tablet (100 mg total) by mouth 2 (two) times daily.     metoCLOPramide 10 MG tablet  Commonly known as:  REGLAN  Take 1 tablet (10 mg total) by mouth 3 (three) times daily before meals.     ondansetron 4 MG disintegrating tablet  Commonly known as:  ZOFRAN ODT  Take 1 tablet (4 mg  total) by mouth every 8 (eight) hours as needed for nausea or vomiting.     prenatal multivitamin Tabs tablet  Take 1 tablet by mouth daily at 12 noon.         Sharen Counter Certified Nurse-Midwife 05/02/2015  1:03 PM

## 2015-05-02 NOTE — MAU Note (Signed)
Induced for pre-eclampsia; vag delivery on 8/13.  Sent from dr's office this morning for further eval.  BP elevated.  +HA, has not taken anything for it.  Denies visual changes, epigastric pain or increase in swelling.   Baby is doing  Well, bottle feeding.

## 2015-05-02 NOTE — H&P (Signed)
HPI: 24 y/o G2P1011 s/p NSVD on 8/12 who was induced for gestational HTN and concern for IUGR.  Delivery was uncomplicated and during her hospital stay, her blood pressure was mostly 140/90s.  She did require IV Labetalol  once during her postpartum course and was started on Labetalol  bid.  She was seen in the office today for follow up BP and it was noted to be 180/120 and 117/118.  Pt reporting the same mild headache.  No blurry vision, no RUQ pain.  No other acute complaints.   OBHx: TAB x 1, FTNSVD x1, female, 5#0oz PMHx: none Meds: PNV, Zofran, Diclegis, Reglan, Fioricet Allergy: No Known Allergies SurgHx: none SocHx: no Tobacco, no EtOH, no Illicit Drugs  Results for orders placed or performed during the hospital encounter of 05/02/15 (from the past 24 hour(s))  CBC with Differential     Status: Abnormal   Collection Time: 05/02/15 10:50 AM  Result Value Ref Range   WBC 5.0 4.0 - 10.5 K/uL   RBC 3.45 (L) 3.87 - 5.11 MIL/uL   Hemoglobin 10.4 (L) 12.0 - 15.0 g/dL   HCT 16.1 (L) 09.6 - 04.5 %   MCV 90.1 78.0 - 100.0 fL   MCH 30.1 26.0 - 34.0 pg   MCHC 33.4 30.0 - 36.0 g/dL   RDW 40.9 81.1 - 91.4 %   Platelets 263 150 - 400 K/uL   Neutrophils Relative % 67 43 - 77 %   Neutro Abs 3.3 1.7 - 7.7 K/uL   Lymphocytes Relative 21 12 - 46 %   Lymphs Abs 1.0 0.7 - 4.0 K/uL   Monocytes Relative 10 3 - 12 %   Monocytes Absolute 0.5 0.1 - 1.0 K/uL   Eosinophils Relative 3 0 - 5 %   Eosinophils Absolute 0.1 0.0 - 0.7 K/uL   Basophils Relative 0 0 - 1 %   Basophils Absolute 0.0 0.0 - 0.1 K/uL    A/P:  24 y.o. G2P1011 admitted for postpartum preeclampsia with severe features due to elevated blood pressure. -pt to be started on IV Magnesium -s/p IV Labetalol x3 and Hydralazine  IV, will increase home Labetalol to  bid and consider adding Procardia 30XL daily -Headache: motrin and tylenol prn -General diet -May ambulate as tolerated, SCDs while in bed -Labs stable as  above, PC ratio pending -Continue routine postpartum care  DISPO: Pt admitted for 24hr of Magnesium and management of BP  Myna Hidalgo, DO [phone] (pager) [phone] (office)

## 2015-05-03 DIAGNOSIS — O1415 Severe pre-eclampsia, complicating the puerperium: Secondary | ICD-10-CM | POA: Diagnosis present

## 2015-05-03 MED ORDER — SENNOSIDES-DOCUSATE SODIUM 8.6-50 MG PO TABS
1.0000 | ORAL_TABLET | Freq: Two times a day (BID) | ORAL | Status: DC
  Administered 2015-05-03 (×2): 1 via ORAL
  Filled 2015-05-03 (×3): qty 1

## 2015-05-03 MED ORDER — LABETALOL HCL 300 MG PO TABS
400.0000 mg | ORAL_TABLET | Freq: Two times a day (BID) | ORAL | Status: DC
  Administered 2015-05-03: 400 mg via ORAL
  Filled 2015-05-03 (×2): qty 1

## 2015-05-03 MED ORDER — LABETALOL HCL 100 MG PO TABS
200.0000 mg | ORAL_TABLET | Freq: Once | ORAL | Status: AC
Start: 2015-05-03 — End: 2015-05-03
  Administered 2015-05-03: 200 mg via ORAL
  Filled 2015-05-03: qty 2

## 2015-05-03 MED ORDER — LABETALOL HCL 200 MG PO TABS
200.0000 mg | ORAL_TABLET | Freq: Two times a day (BID) | ORAL | Status: DC
Start: 2015-05-03 — End: 2016-10-24

## 2015-05-03 MED ORDER — BISACODYL 5 MG PO TBEC
5.0000 mg | DELAYED_RELEASE_TABLET | Freq: Every day | ORAL | Status: DC | PRN
  Filled 2015-05-03: qty 1

## 2015-05-03 NOTE — Plan of Care (Signed)
Problem: Phase I Progression Outcomes Goal: No significant worsening bleeding/cervix change/vag drainage No significant worsening in vaginal bleeding, cervical change, or vaginal drainage.  Outcome: Progressing Normal lochia

## 2015-05-03 NOTE — Plan of Care (Signed)
Problem: Phase II Progression Outcomes Goal: Medications as ordered (see description) Medications as ordered (Magnesium Sulfate, Steroids, PO Tocolysis, Antibiotics, Terbutaline Pump, Other)  Outcome: Progressing Magnesium sulfate d/c'ed. Instructed patient on labetalol. Patient receptive.

## 2015-05-03 NOTE — Progress Notes (Signed)
Readmit postpartum Note- HD#2  S: Patient resting comfortably in bed.  Still notes 3/10 headache, no blurry vision, no RUQ pain.  Slight chills overnight, no fever.  No CP/SOB.  Tolerating general diet.  Pt has not had a BM since delivery, +flatus.  O: BP 130/76 mmHg  Pulse 88  Temp(Src) 97.7 F (36.5 C) (Oral)  Resp 16  Ht  (1.651 m)  Wt 75.07 kg (165 lb 8 oz)  BMI 27.54 kg/m2  SpO2 99%  Breastfeeding? No  BP range: 123-145/89-90 (past 12hrs) UOP: 1300cc/8hr  Gen: NAD CV: RRR Lungs:CTAB Abd: soft, non-tender, uterus below umbilicus, +BS Ext: No edema, no calf tenderness bilaterally, no clonus  A/P: 24yo G2P1011 re-admitted for postpartum preeclampsia with severe features.  -BP significantly improved over the past 12hrs, plan to discontinue Mag today and watch BP.  Currently on Labetalol  bid -Headache remains unchanged, no other symptoms -GI: Pt to continue with stool softener twice daily, oral Dulcolax added, encouraged pt consider suppository.  Tolerating gen diet without problems -GU: Adequate urine output, voiding freely  DISPO:  Pending blood pressure off Magnesium, would consider discharge home this afternoon.  Myna Hidalgo, DO [phone] (pager) [phone] (office)

## 2015-05-03 NOTE — Discharge Instructions (Addendum)
Preeclampsia and Eclampsia Preeclampsia is a serious condition that develops only during pregnancy. This condition causes high blood pressure along with other symptoms, such as swelling and headaches. These may develop as the condition gets worse. Preeclampsia may occur 20 weeks or later into your pregnancy.  RISK FACTORS The cause of preeclampsia is not known. You may be more likely to develop preeclampsia if you have certain risk factors. These include:   Being pregnant for the first time.  Having preeclampsia in a past pregnancy.  Having a family history of preeclampsia.  Having high blood pressure.  Being pregnant with twins or triplets.  Being 48 or older.  Being African American.  Having kidney disease or diabetes.  Having medical conditions such as lupus or blood diseases.  Being very overweight (obese). SIGNS AND SYMPTOMS  The earliest signs of preeclampsia are:  High blood pressure.  Increased protein in your urine. Your health care provider will check for this at every prenatal visit. Other symptoms that can develop include:   Severe headaches.  Sudden weight gain.  Swelling of your hands, face, legs, and feet.  Feeling sick to your stomach (nauseous) and throwing up (vomiting).  Vision problems (blurred or double vision).  Numbness in your face, arms, legs, and feet.  Dizziness.  Slurred speech.  Sensitivity to bright lights.  Abdominal pain. DIAGNOSIS  There are no screening tests for preeclampsia. Your health care provider will ask you about symptoms and check for signs of preeclampsia during your prenatal visits. You may also have tests, including:  Urine testing.  Blood testing.  Checking your baby's heart rate.  Checking the health of your baby and your placenta using images created with sound waves (ultrasound). TREATMENT  You can work out the best treatment approach together with your health care provider. It is very important to keep  all prenatal appointments. If you have an increased risk of preeclampsia, you may need more frequent prenatal exams.  Your health care provider may prescribe bed rest.  You may have to eat as little salt as possible.  You may need to take medicine to lower your blood pressure if the condition does not respond to more conservative measures.  You may need to stay in the hospital if your condition is severe. There, treatment will focus on controlling your blood pressure and fluid retention. You may also need to take medicine to prevent seizures.  If the condition gets worse, your baby may need to be delivered early to protect you and the baby. You may have your labor started with medicine (be induced), or you may have a cesarean delivery.  Preeclampsia usually goes away after the baby is born. HOME CARE INSTRUCTIONS   Only take over-the-counter or prescription medicines as directed by your health care provider.  FOR BLOOD PRESSURE: Take Labetalol  twice daily AND Procardia XL  daily  Elevate your feet while resting.  Avoid caffeine and alcohol.  Do not smoke.  Drink 6-8 glasses of water every day.  Eat a balanced diet that is low in salt. Do not add salt to your food.  Avoid stressful situations as much as possible.  Get plenty of rest and sleep.  Keep all appointments and tests as scheduled. SEEK MEDICAL CARE IF:  You are gaining more weight than expected.  You have any headaches, abdominal pain, or nausea.  You are bruising more than usual.  You feel dizzy or light-headed. SEEK IMMEDIATE MEDICAL CARE IF:   You develop sudden or  severe swelling anywhere in your body. This usually happens in the legs.  You gain 5 lb (2.3 kg) or more in a week.  You have a severe headache, dizziness, problems with your vision, or confusion.  You have severe abdominal pain.  You have lasting nausea or vomiting.  You have a seizure.  You have trouble moving any part of  your body.  You develop numbness in your body.  You have trouble speaking.  You have any abnormal bleeding.  You develop a stiff neck.  You pass out. MAKE SURE YOU:   Understand these instructions.  Will watch your condition.  Will get help right away if you are not doing well or get worse. Document Released: 08/30/2000 Document Revised: 09/07/2013 Document Reviewed: 06/25/2013 [facility] Patient Information 2015 [place], [place]. This information is not intended to replace advice given to you by your health care provider. Make sure you discuss any questions you have with your health care provider.

## 2015-05-04 MED ORDER — HYDRALAZINE HCL 20 MG/ML IJ SOLN
10.0000 mg | Freq: Once | INTRAMUSCULAR | Status: AC
  Administered 2015-05-04: 10 mg via INTRAVENOUS
  Filled 2015-05-04: qty 1

## 2015-05-04 MED ORDER — LABETALOL HCL 300 MG PO TABS
400.0000 mg | ORAL_TABLET | Freq: Two times a day (BID) | ORAL | Status: DC
  Administered 2015-05-04: 400 mg via ORAL
  Filled 2015-05-04 (×2): qty 1

## 2015-05-04 MED ORDER — LABETALOL HCL 300 MG PO TABS
400.0000 mg | ORAL_TABLET | Freq: Three times a day (TID) | ORAL | Status: DC
  Filled 2015-05-04 (×2): qty 1

## 2015-05-04 MED ORDER — NIFEDIPINE ER 30 MG PO TB24: 30.0000 mg | ORAL_TABLET | Freq: Every day | ORAL | Status: DC

## 2015-05-04 MED ORDER — NIFEDIPINE ER OSMOTIC RELEASE 30 MG PO TB24
30.0000 mg | ORAL_TABLET | Freq: Every day | ORAL | Status: DC
  Administered 2015-05-04: 30 mg via ORAL
  Filled 2015-05-04: qty 1

## 2015-05-04 MED ORDER — LABETALOL HCL 200 MG PO TABS
400.0000 mg | ORAL_TABLET | Freq: Two times a day (BID) | ORAL | Status: DC
Start: 2015-05-04 — End: 2016-10-24

## 2015-05-04 NOTE — Progress Notes (Signed)
Readmit postpartum Note- HD#3  S: Patient resting comfortably in bed.  Headache now resolved, blurry vision, no RUQ pain.  No F/C/CP/SOB.  Tolerating general diet.  +BM.  Pt seems frustrated that she has to be here- discussed with patient importance of good blood pressure control.  O: BP 163/97 mmHg  Pulse 72  Temp(Src) 98.6 F (37 C) (Oral)  Resp 18  Ht  (1.651 m)  Wt 76.885 kg (169 lb 8 oz)  BMI 28.21 kg/m2  SpO2 99%  Breastfeeding? No BP range: 132-176/69-107  Gen: NAD CV: RRR Lungs:CTAB Abd: soft, non-tender, uterus below umbilicus, +BS Ext: No edema, no calf tenderness bilaterally, no clonus  A/P: 24yo G2P1011 re-admitted for postpartum preeclampsia with severe features.  -BP elevated this am, will add Procardia XL  daily along with Labetalol  bid.  Will continue to monitor closely this am.  S/p IV Magnesium -Headache now resolved -GI: Pt to continue with stool softener twice daily, tolerating gen diet without problems -GU: Adequate urine output, voiding freely  DISPO:  Continue with management as above, continue to adjust BP medications BP out of severe range.  Myna Hidalgo, DO [phone] (pager) [phone] (office)

## 2015-05-04 NOTE — Progress Notes (Signed)
Continue to observe closely

## 2015-05-04 NOTE — Progress Notes (Signed)
UR chart review completed.  

## 2015-05-05 NOTE — Discharge Summary (Addendum)
Physician Discharge Summary  Patient ID: Kathleen Mccall MRN: [id number] DOB/AGE: 05/30/1991 24 y.o.  Admit date: 05/02/2015 Discharge date: 05/04/2015  Admission Diagnoses:  Postpartum preeclampsia  Discharge Diagnoses:  Principal Problem:   Pre-eclampsia or eclampsia superimposed on pre-existing hypertension, postpartum Active Problems:   Gestational hypertension w/o significant proteinuria in 3rd trimester   Hypertension in pregnancy, preeclampsia, severe, delivered/postpartum   Discharged Condition: stable  Hospital Course: 24yo G2P1011 who was readmitted following her delivery due to postpartum preeclampsia.  Her delivery had been complicated by gestational HTN and during a follow up visit her blood pressure was found to be 170s/110s with persistent headache.  Pt was treated with IV Magnesium and required both IV Labetalol and Hydralazine to help control her blood pressure.  Her oral blood pressure medication was adjusted until well controlled.  She was discharged home on Labetalol $RemoveBefo'400mg'rrzkvWbViIP$  bid and Procardia XL $RemoveBefo'30mg'bDHyugFqaKC$  daily.  Her headache had also resolved during her hospital stay.  Plan for follow up in one week for BP monitoring.  Consults: None  Significant Diagnostic Studies: labs: Results for NOVIS, LEAGUE (MRN [id number]) as of 05/05/2015 09:56  Ref. Range 05/02/2015 10:50 05/02/2015 11:35  Sodium Latest Ref Range: 135-145 mmol/L 137   Potassium Latest Ref Range: 3.5-5.1 mmol/L 4.3   Chloride Latest Ref Range: 101-111 mmol/L 106   CO2 Latest Ref Range: 22-32 mmol/L 23   BUN Latest Ref Range: 6-20 mg/dL 7   Creatinine Latest Ref Range: 0.44-1.00 mg/dL 0.73   Calcium Latest Ref Range: 8.9-10.3 mg/dL 8.8 (L)   EGFR (Non-African Amer.) Latest Ref Range: >60 mL/min >60   EGFR (African American) Latest Ref Range: >60 mL/min >60   Glucose Latest Ref Range: 65-99 mg/dL 74   Anion gap Latest Ref Range: 5-15  8   Alkaline Phosphatase Latest Ref Range: 38-126 U/L 129 (H)   Albumin Latest  Ref Range: 3.5-5.0 g/dL 3.4 (L)   Uric Acid, Serum Latest Ref Range: 2.3-6.6 mg/dL 4.5   AST Latest Ref Range: 15-41 U/L 27   ALT Latest Ref Range: 14-54 U/L 16   Total Protein Latest Ref Range: 6.5-8.1 g/dL 6.8   Total Bilirubin Latest Ref Range: 0.3-1.2 mg/dL 0.8   WBC Latest Ref Range: 4.0-10.5 K/uL 5.0   RBC Latest Ref Range: 3.87-5.11 MIL/uL 3.45 (L)   Hemoglobin Latest Ref Range: 12.0-15.0 g/dL 10.4 (L)   HCT Latest Ref Range: 36.0-46.0 % 31.1 (L)   MCV Latest Ref Range: 78.0-100.0 fL 90.1   MCH Latest Ref Range: 26.0-34.0 pg 30.1   MCHC Latest Ref Range: 30.0-36.0 g/dL 33.4   RDW Latest Ref Range: 11.5-15.5 % 13.1   Platelets Latest Ref Range: 150-400 K/uL 263   Neutrophils Latest Ref Range: 43-77 % 67   Lymphocytes Latest Ref Range: 12-46 % 21   Monocytes Relative Latest Ref Range: 3-12 % 10   Eosinophil Latest Ref Range: 0-5 % 3   Basophil Latest Ref Range: 0-1 % 0   NEUT# Latest Ref Range: 1.7-7.7 K/uL 3.3   Lymphocyte # Latest Ref Range: 0.7-4.0 K/uL 1.0   Monocyte # Latest Ref Range: 0.1-1.0 K/uL 0.5   Eosinophils Absolute Latest Ref Range: 0.0-0.7 K/uL 0.1   Basophils Absolute Latest Ref Range: 0.0-0.1 K/uL 0.0   Appearance Latest Ref Range: CLEAR   CLEAR  Bacteria, UA Latest Ref Range: RARE   RARE  Bilirubin Urine Latest Ref Range: NEGATIVE   NEGATIVE  Color, Urine Latest Ref Range: YELLOW   YELLOW  Glucose Latest Ref Range: NEGATIVE  mg/dL  NEGATIVE  Hgb urine dipstick Latest Ref Range: NEGATIVE   MODERATE (A)  Ketones, ur Latest Ref Range: NEGATIVE mg/dL  NEGATIVE  Leukocytes, UA Latest Ref Range: NEGATIVE   SMALL (A)  Nitrite Latest Ref Range: NEGATIVE   NEGATIVE  pH Latest Ref Range: 5.0-8.0   7.0  Protein Latest Ref Range: NEGATIVE mg/dL  NEGATIVE  RBC / HPF Latest Ref Range: <3 RBC/hpf  3-6  Specific Gravity, Urine Latest Ref Range: 1.005-1.030   1.010  Squamous Epithelial / LPF Latest Ref Range: RARE   FEW (A)  Urobilinogen, UA Latest Ref Range: 0.0-1.0  mg/dL  0.2  Total Protein, Urine Latest Units: mg/dL  8  Protein Creatinine Ratio Latest Ref Range: 0.00-0.15 mg/mgCre  0.15  Creatinine, Urine Latest Units: mg/dL  52.00  Treatments: IV anti-hypertensives, IV Magnesium  Discharge Exam: Blood pressure 138/77, pulse 91, temperature 99.3 F (37.4 C), temperature source Oral, resp. rate 16, height _0  (1.651 m), weight 76.885 kg (169 lb 8 oz), SpO2 99 %, not currently breastfeeding. Gen: NADCV: RRRLungs:CTAB Abd: soft, non-tender, uterus below umbilicus, +BS Ext: No edema, no calf tenderness bilaterally, no clonus  Disposition: 01-Home or Self Care     Medication List    STOP taking these medications        metoCLOPramide 10 MG tablet  Commonly known as:  REGLAN     ondansetron 4 MG disintegrating tablet  Commonly known as:  ZOFRAN ODT      TAKE these medications        ibuprofen 600 MG tablet  Commonly known as:  ADVIL,MOTRIN  Take 1 tablet (600 mg total) by mouth every 6 (six) hours as needed.     labetalol 200 MG tablet  Commonly known as:  NORMODYNE  Take 1 tablet (200 mg total) by mouth 2 (two) times daily.     labetalol 200 MG tablet  Commonly known as:  NORMODYNE  Take 2 tablets (400 mg total) by mouth 2 (two) times daily.     NIFEdipine 30 MG 24 hr tablet  Commonly known as:  PROCARDIA-XL/ADALAT CC  Take 1 tablet (30 mg total) by mouth daily.     prenatal multivitamin Tabs tablet  Take 1 tablet by mouth daily at 12 noon.           Follow-up Information    Follow up with Janyth Pupa, M, DO In 1 week.   Specialty:  Obstetrics and Gynecology   Contact information:   [address] [place] [address] [phone]       Signed: Annalee Genta 05/05/2015, 9:53 AM

## 2016-01-10 IMAGING — US US FETAL BPP W/O NONSTRESS
1 series · 13 of 28 positions shown · non-contrast
Comparison: none

OBSTETRICS REPORT
(Signed Final 03/13/2015 [DATE])

Name:       AUNTYJATTY DELOWR                       Visit  03/13/2015 [DATE]
Date:
Service(s) Provided
Indications
31 weeks gestation of pregnancy
Non-reactive NST, FHR decelerations
Decreased fetal movement
Fetal Evaluation
Num Of             1
Fetuses:
Fetal Heart        126                          bpm
Rate:
Cardiac Activity:  Observed
Presentation:      Cephalic
Amniotic Fluid
AFI FV:      Subjectively within normal limits
AFI Sum:     16.59    cm      60  %Tile     Larg Pckt:    5.56   cm
RUQ:   5.56    cm    RLQ:   3.01    cm   LUQ:    3.52    cm   LLQ:    4.5    cm
Biophysical Evaluation
Amniotic F.V:   Pocket => 2 cm two          F. Tone:        Observed
planes
F. Movement:    Observed                    Score:          [DATE]
F. Breathing:   Observed
Gestational Age
LMP:           31w 1d        Date:  08/07/14                  EDD:   05/14/15
Best:          31w 1d    Det. By:   LMP  (08/07/14)           EDD:   05/14/15
Impression
INDICATION: 23 yr old G1P0 at 66w6d with vomiting,
decreased fetal movement, and nonreactive NST for BPP.
Remote read.

[Series 1: us fetal bpp w/o nonstress · non-contrast · 38 acquisitions, 13 frames shown]
[im 2/38]
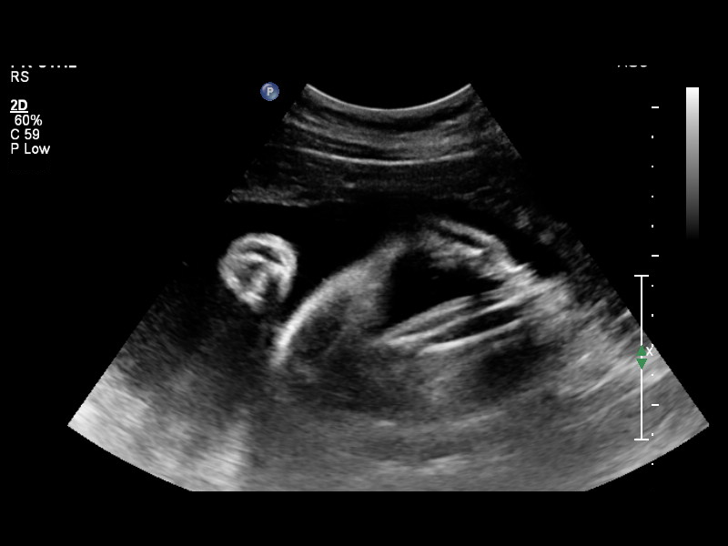
[im 5/38]
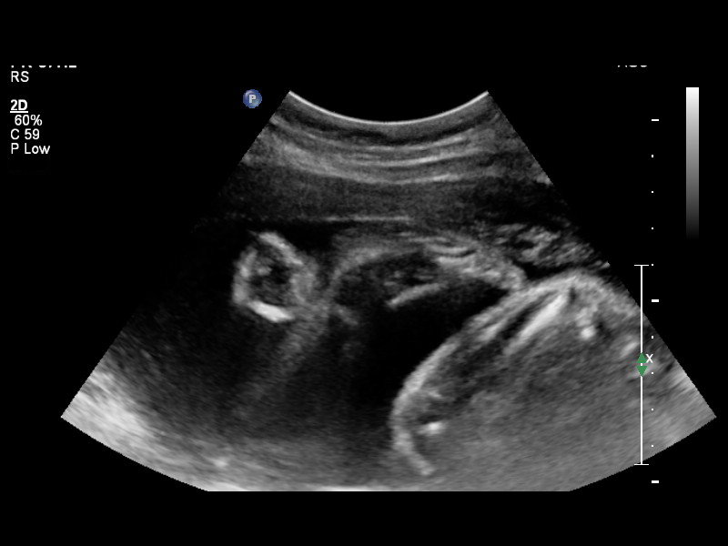
[im 7/38]
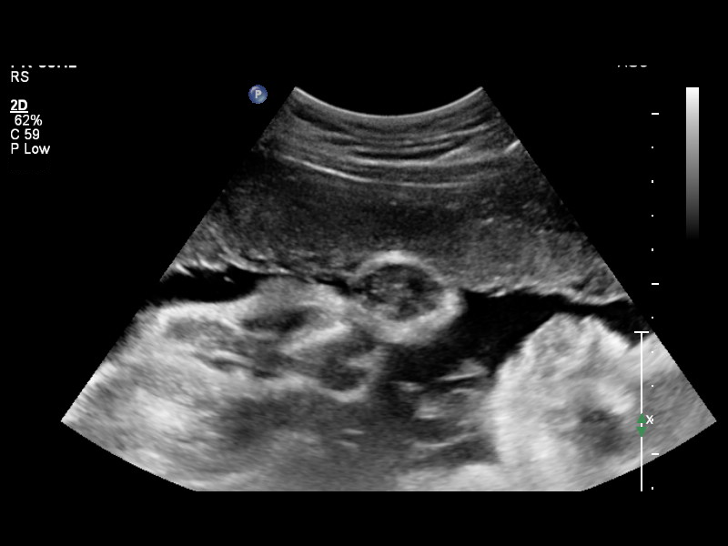
[im 10/38]
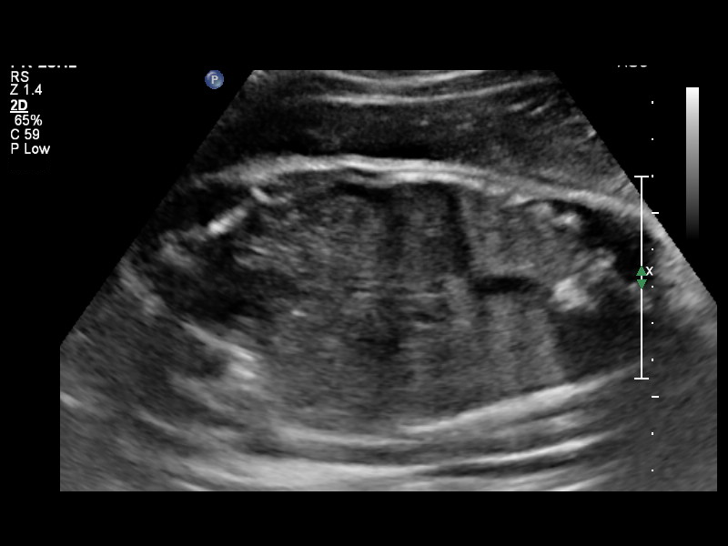
[im 13/38]
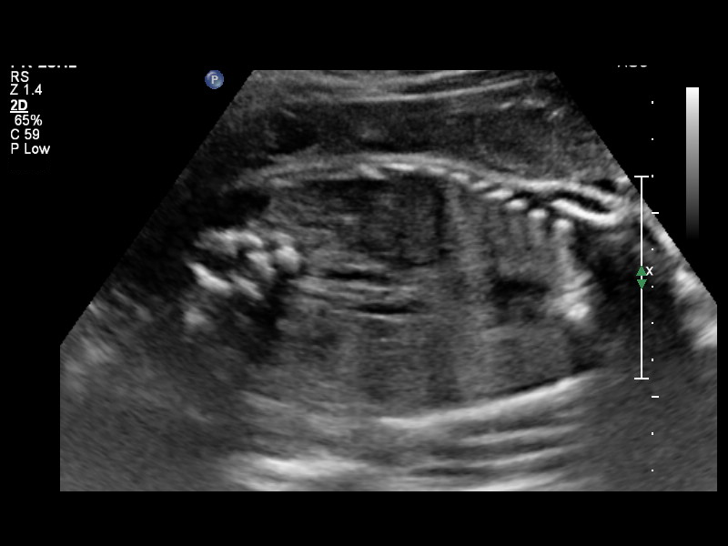
[im 16/38]
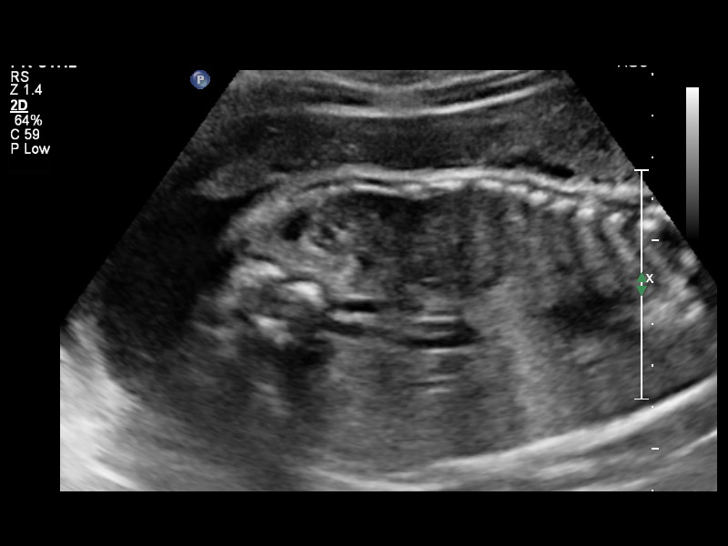
[im 20/38]
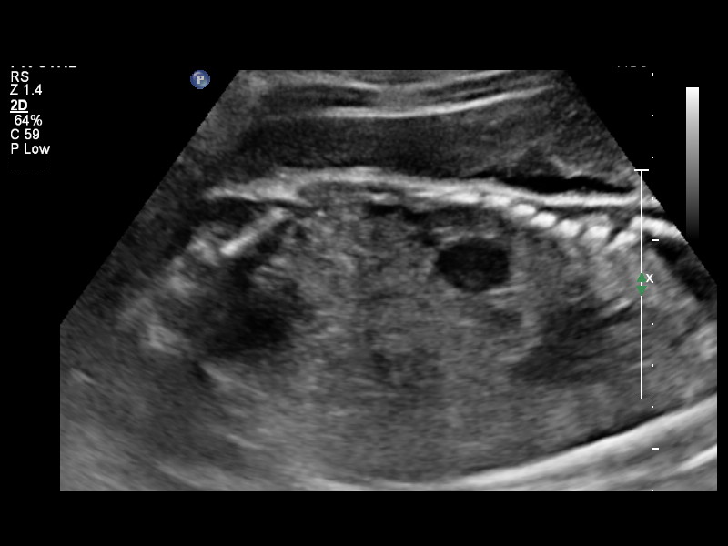
[im 22/38]
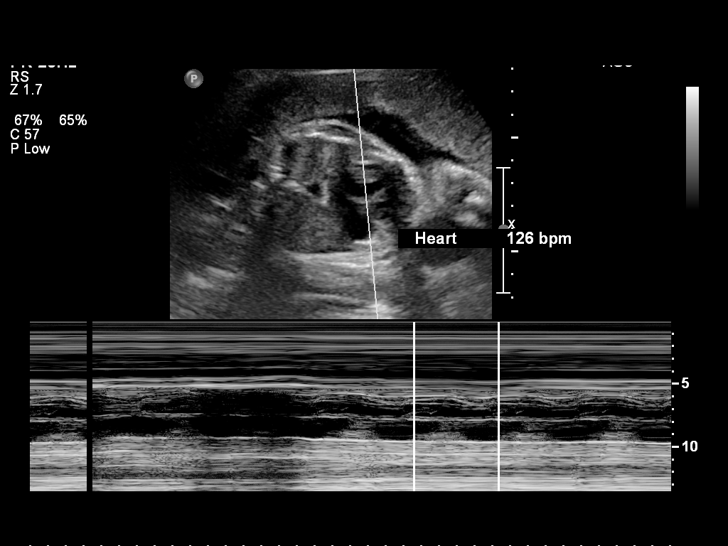
[im 25/38]
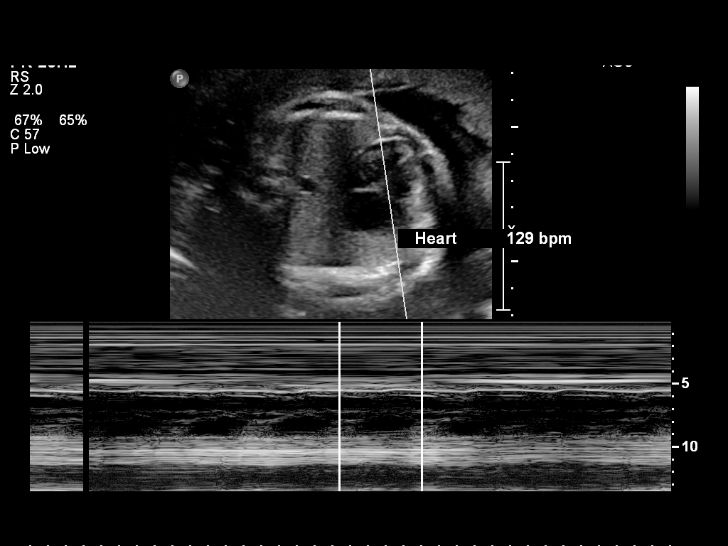
[im 28/38]
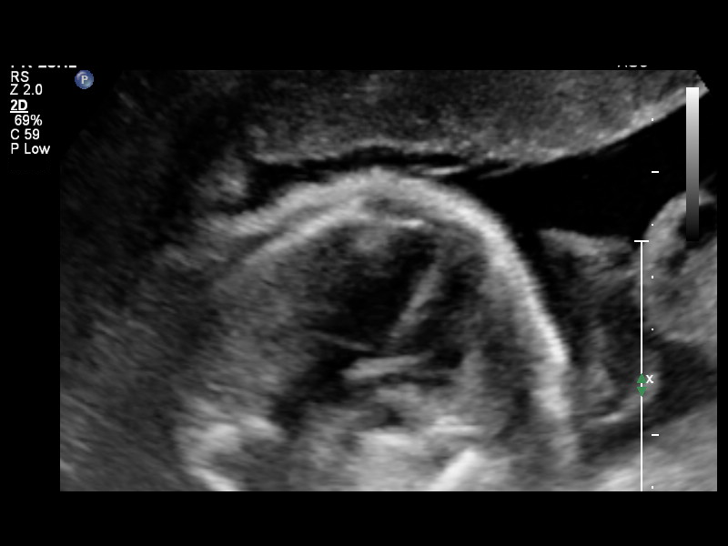
[im 31/38]
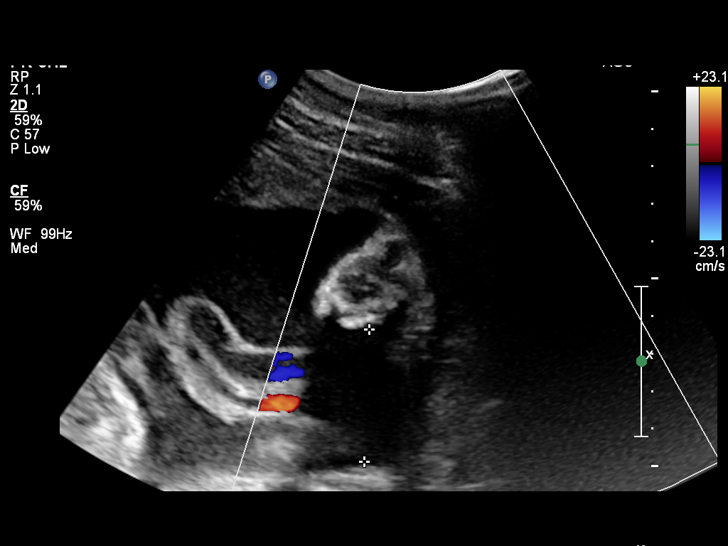
[im 33/38]
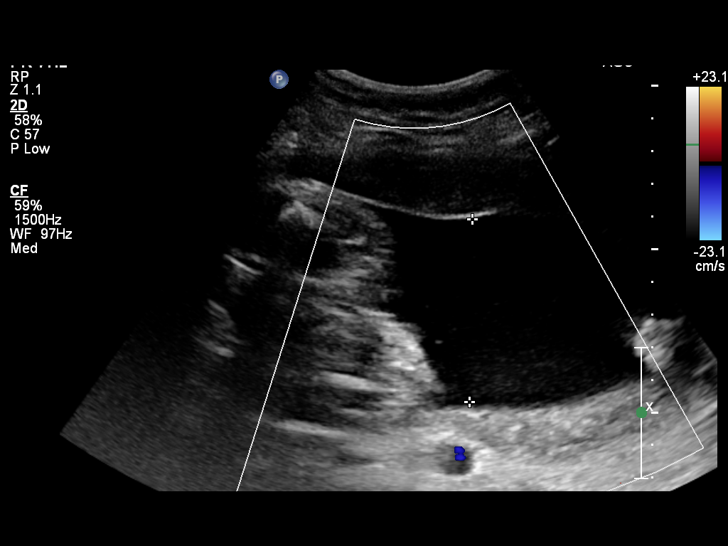
[im 36/38]
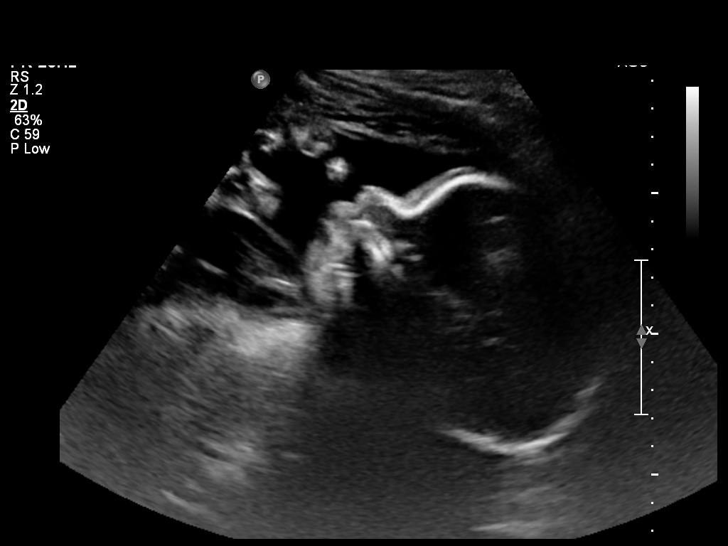

[13 of 28 positions shown; findings below may reference images not displayed]

FINDINGS: 1. Single intrauterine pregnancy.
2. Fetus in cephalic presentation.
3. Normal amnioitic fluid index.
4. Normal biophysical profile of [DATE].
Recommendations

1. Normal BPP of [DATE].
2. Management per primary OB.

## 2016-04-18 LAB — OB RESULTS CONSOLE ANTIBODY SCREEN: Antibody Screen: NEGATIVE

## 2016-04-18 LAB — OB RESULTS CONSOLE ABO/RH: "RH Type ": POSITIVE

## 2016-04-18 LAB — OB RESULTS CONSOLE RUBELLA ANTIBODY, IGM: Rubella: IMMUNE

## 2016-04-18 LAB — OB RESULTS CONSOLE RPR: RPR: NONREACTIVE

## 2016-04-18 LAB — OB RESULTS CONSOLE GC/CHLAMYDIA
Chlamydia: NEGATIVE
Gonorrhea: NEGATIVE

## 2016-04-18 LAB — OB RESULTS CONSOLE HEPATITIS B SURFACE ANTIGEN: HEP B S AG: NEGATIVE

## 2016-04-18 LAB — OB RESULTS CONSOLE HIV ANTIBODY (ROUTINE TESTING): HIV: NONREACTIVE

## 2016-09-16 NOTE — L&D Delivery Note (Signed)
Delivery Note The patient pushed very well for 5 minutes with some bradycardia noted with pushing.  At 5:21 AM a viable female was delivered via Vaginal, Spontaneous Delivery (Presentation LOA);    APGAR: 8, 9; weight pending .  Baby was vigorous at delivery but appeared SGA. Placenta status:delivered spontaneously--sent for pathology .  Cord:  with the following complications:none .   Anesthesia:  epidural Episiotomy: None Lacerations: 1st degree Suture Repair: 3.0 vicryl rapide Est. Blood Loss (mL): 150ml  Mom to postpartum.  Baby to Couplet care / Skin to Skin. D/w pt circumcision and they desire in the office.  Oliver PilaICHARDSON,Landis Cassaro W 10/25/2016, 6:02 AM

## 2016-09-20 LAB — OB RESULTS CONSOLE GBS: GBS: NEGATIVE

## 2016-10-24 ENCOUNTER — Encounter (HOSPITAL_COMMUNITY): Payer: Self-pay | Admitting: *Deleted

## 2016-10-24 ENCOUNTER — Inpatient Hospital Stay (HOSPITAL_COMMUNITY)
Admission: AD | Admit: 2016-10-24 | Discharge: 2016-10-26 | DRG: 775 | Disposition: A | Discharge status: Home / Self Care | Payer: Medicaid Other | Source: Ambulatory Visit | Attending: Obstetrics and Gynecology | Admitting: Obstetrics and Gynecology

## 2016-10-24 DIAGNOSIS — Z8249 Family history of ischemic heart disease and other diseases of the circulatory system: Secondary | ICD-10-CM

## 2016-10-24 DIAGNOSIS — Z3A39 39 weeks gestation of pregnancy: Secondary | ICD-10-CM

## 2016-10-24 DIAGNOSIS — O4292 Full-term premature rupture of membranes, unspecified as to length of time between rupture and onset of labor: Secondary | ICD-10-CM | POA: Diagnosis present

## 2016-10-24 DIAGNOSIS — Z6833 Body mass index (BMI) 33.0-33.9, adult: Secondary | ICD-10-CM

## 2016-10-24 DIAGNOSIS — Z833 Family history of diabetes mellitus: Secondary | ICD-10-CM

## 2016-10-24 DIAGNOSIS — O4290 Premature rupture of membranes, unspecified as to length of time between rupture and onset of labor, unspecified weeks of gestation: Secondary | ICD-10-CM | POA: Diagnosis present

## 2016-10-24 DIAGNOSIS — E669 Obesity, unspecified: Secondary | ICD-10-CM | POA: Diagnosis present

## 2016-10-24 DIAGNOSIS — O365930 Maternal care for other known or suspected poor fetal growth, third trimester, not applicable or unspecified: Secondary | ICD-10-CM | POA: Diagnosis present

## 2016-10-24 DIAGNOSIS — O99214 Obesity complicating childbirth: Secondary | ICD-10-CM | POA: Diagnosis present

## 2016-10-24 LAB — CBC
HEMATOCRIT: 30.9 % — AB (ref 36.0–46.0)
Hemoglobin: 10.7 g/dL — ABNORMAL LOW (ref 12.0–15.0)
MCH: 28.8 pg (ref 26.0–34.0)
MCHC: 34.6 g/dL (ref 30.0–36.0)
MCV: 83.3 fL (ref 78.0–100.0)
Platelets: 235 10*3/uL (ref 150–400)
RBC: 3.71 MIL/uL — ABNORMAL LOW (ref 3.87–5.11)
RDW: 13.1 % (ref 11.5–15.5)
WBC: 7.6 10*3/uL (ref 4.0–10.5)

## 2016-10-24 LAB — TYPE AND SCREEN
ABO/RH(D): O POS
Antibody Screen: NEGATIVE

## 2016-10-24 LAB — POCT FERN TEST: POCT Fern Test: POSITIVE

## 2016-10-24 LAB — AMNISURE RUPTURE OF MEMBRANE (ROM) NOT AT ARMC: Amnisure ROM: POSITIVE

## 2016-10-24 MED ORDER — LIDOCAINE HCL (PF) 1 % IJ SOLN
30.0000 mL | INTRAMUSCULAR | Status: DC | PRN
  Filled 2016-10-24: qty 30

## 2016-10-24 MED ORDER — PHENYLEPHRINE 40 MCG/ML (10ML) SYRINGE FOR IV PUSH (FOR BLOOD PRESSURE SUPPORT)
80.0000 ug | PREFILLED_SYRINGE | INTRAVENOUS | Status: DC | PRN
  Filled 2016-10-24: qty 5
  Filled 2016-10-24: qty 10

## 2016-10-24 MED ORDER — LACTATED RINGERS IV SOLN
INTRAVENOUS | Status: DC
  Administered 2016-10-24 – 2016-10-25 (×3): via INTRAVENOUS

## 2016-10-24 MED ORDER — TERBUTALINE SULFATE 1 MG/ML IJ SOLN
0.2500 mg | Freq: Once | INTRAMUSCULAR | Status: DC | PRN
  Filled 2016-10-24: qty 1

## 2016-10-24 MED ORDER — DIPHENHYDRAMINE HCL 50 MG/ML IJ SOLN: 12.5000 mg | INTRAMUSCULAR | Status: DC | PRN

## 2016-10-24 MED ORDER — OXYTOCIN 40 UNITS IN LACTATED RINGERS INFUSION - SIMPLE MED: 2.5000 [IU]/h | INTRAVENOUS | Status: DC

## 2016-10-24 MED ORDER — ACETAMINOPHEN 325 MG PO TABS: 650.0000 mg | ORAL_TABLET | ORAL | Status: DC | PRN

## 2016-10-24 MED ORDER — FENTANYL 2.5 MCG/ML BUPIVACAINE 1/10 % EPIDURAL INFUSION (WH - ANES)
14.0000 mL/h | INTRAMUSCULAR | Status: DC | PRN
  Administered 2016-10-25: 14 mL/h via EPIDURAL
  Filled 2016-10-24: qty 100

## 2016-10-24 MED ORDER — LACTATED RINGERS IV SOLN
500.0000 mL | Freq: Once | INTRAVENOUS | Status: AC
  Administered 2016-10-25: 500 mL via INTRAVENOUS

## 2016-10-24 MED ORDER — BUTORPHANOL TARTRATE 1 MG/ML IJ SOLN
1.0000 mg | INTRAMUSCULAR | Status: DC | PRN
  Administered 2016-10-25: 1 mg via INTRAVENOUS
  Filled 2016-10-24: qty 1

## 2016-10-24 MED ORDER — EPHEDRINE 5 MG/ML INJ
10.0000 mg | INTRAVENOUS | Status: DC | PRN
  Filled 2016-10-24: qty 4

## 2016-10-24 MED ORDER — SOD CITRATE-CITRIC ACID 500-334 MG/5ML PO SOLN: 30.0000 mL | ORAL | Status: DC | PRN

## 2016-10-24 MED ORDER — OXYTOCIN 40 UNITS IN LACTATED RINGERS INFUSION - SIMPLE MED
1.0000 m[IU]/min | INTRAVENOUS | Status: DC
  Administered 2016-10-24: 2 m[IU]/min via INTRAVENOUS
  Filled 2016-10-24: qty 1000

## 2016-10-24 MED ORDER — OXYTOCIN BOLUS FROM INFUSION
500.0000 mL | Freq: Once | INTRAVENOUS | Status: AC
  Administered 2016-10-25: 500 mL via INTRAVENOUS

## 2016-10-24 MED ORDER — OXYCODONE-ACETAMINOPHEN 5-325 MG PO TABS: 1.0000 | ORAL_TABLET | ORAL | Status: DC | PRN

## 2016-10-24 MED ORDER — ONDANSETRON HCL 4 MG/2ML IJ SOLN: 4.0000 mg | Freq: Four times a day (QID) | INTRAMUSCULAR | Status: DC | PRN

## 2016-10-24 MED ORDER — LACTATED RINGERS IV SOLN
500.0000 mL | INTRAVENOUS | Status: DC | PRN
  Administered 2016-10-25: 500 mL via INTRAVENOUS

## 2016-10-24 MED ORDER — OXYCODONE-ACETAMINOPHEN 5-325 MG PO TABS: 2.0000 | ORAL_TABLET | ORAL | Status: DC | PRN

## 2016-10-24 MED ORDER — PHENYLEPHRINE 40 MCG/ML (10ML) SYRINGE FOR IV PUSH (FOR BLOOD PRESSURE SUPPORT)
80.0000 ug | PREFILLED_SYRINGE | INTRAVENOUS | Status: DC | PRN
  Filled 2016-10-24: qty 10
  Filled 2016-10-24: qty 5

## 2016-10-24 NOTE — MAU Note (Signed)
PT  SAYS SHE HAS BEEN  LEAKING  FLUID - STARTED AT 12 NOON-   THEN MORE  AT 4-5 PM .   AND  UC  STRONGER.     VE IN OFFICE  2  CM LAST WEEK.    DENIES HSV AND MRSA.    GBS- NEG.   LAST SEX-  3   MTHS.   AGO

## 2016-10-24 NOTE — H&P (Addendum)
Kathleen BernheimChristina Mccall is a 26 y.o. female G3P1011 at 7039 6/7 weeks (EDD 10/25/16 by 13 week US inconsistent with LMP)  presenting for SROM maybe with leaking starting as early as noon today.  Prenatal course relatively uncomplicated.  She was induced with preeclampsia last pregnancy, but BP WNL with this one.  US had an isolated EIF.  OB History    Gravida Para Term Preterm AB Living   3 1 1   1 1    SAB TAB Ectopic Multiple Live Births     1   0 1      Obstetric Comments   Induced for preeclampsia    2016 NSVD 5# EAB x1   Past Medical History:  Diagnosis Date  . Abortion on demand   . Anemia    Past Surgical History:  Procedure Laterality Date  . INDUCED ABORTION    . TONSILLECTOMY AND ADENOIDECTOMY     Family History: family history includes Diabetes in her maternal grandfather and mother; Heart disease in her maternal grandfather. Social History:  reports that she has never smoked. She has never used smokeless tobacco. She reports that she does not drink alcohol or use drugs.     Maternal Diabetes: No Genetic Screening: Normal Maternal Ultrasounds/Referrals: Abnormal:  Findings:   Isolated EIF (echogenic intracardiac focus) Fetal Ultrasounds or other Referrals:  None Maternal Substance Abuse:  No Significant Maternal Medications:  None Significant Maternal Lab Results:  None Other Comments:  None  Review of Systems  Gastrointestinal: Negative for abdominal pain.   Maternal Medical History:  Reason for admission: Rupture of membranes.   Contractions: Onset was 6-12 hours ago.   Frequency: irregular.   Perceived severity is mild.    Fetal activity: Perceived fetal activity is normal.    Prenatal Complications - Diabetes: none.    Dilation: 2 Effacement (%): 40 Station: -2 Exam by:: Danella DeisAnna Hayes, RN Blood pressure 137/75, pulse 96, temperature 99.2 F (37.3 C), temperature source Oral, resp. rate 20, height 5\' 5"  (1.651 m), weight 92.2 kg (203 lb 4 oz), not currently  breastfeeding. Maternal Exam:  Uterine Assessment: Contraction strength is mild.  Contraction frequency is irregular.   Abdomen: Patient reports no abdominal tenderness. Fetal presentation: vertex     Physical Exam  Constitutional: She appears well-developed.  Cardiovascular: Normal rate.   Respiratory: Effort normal.  GI: Soft.  Genitourinary: Vagina normal.  Neurological: She is alert.  Psychiatric: She has a normal mood and affect.    Prenatal labs: ABO, Rh:  O positve Antibody:  negative Rubella:  Immune RPR:  NR  HBsAg:   Neg HIV:  Neg  GBS:  Neg One hour GCT 100 Hgb AA First trimester screen negative    Assessment/Plan: Pt with SROM and minimal contractions.  Will admit to L&D and augment with pitocin.  Epidural prn.   Oliver PilaICHARDSON,Kathleen Mccall 10/24/2016, 9:20 PM

## 2016-10-24 NOTE — MAU Provider Note (Signed)
History   [id number]   Chief Complaint  Patient presents with  . Labor Eval    HPI Kathleen Mccall is a 26 y.o. female  G3P1011 here with report of watery vaginal discharge that began at approximately noon today.  Leaking of fluid has continued.  Pt reports contractions every 20 minutes and denies vaginal bleeding.  +fetal movement.   All other systems negative.    No LMP recorded. Patient is pregnant.  OB History  Gravida Para Term Preterm AB Living  3 1 1   1 1   SAB TAB Ectopic Multiple Live Births    1   0 1    # Outcome Date GA Lbr Len/2nd Weight Sex Delivery Anes PTL Lv  3 Current           2 Term 04/28/15 [redacted]w[redacted]d / 00:57 5 lb 0.1 oz (2.272 kg) M Vag-Spont EPI  LIV     Birth Comments: none  1 TAB             Obstetric Comments  Induced for preeclampsia    Past Medical History:  Diagnosis Date  . Abortion on demand   . Anemia     Family History  Problem Relation Age of Onset  . Diabetes Mother   . Diabetes Maternal Grandfather   . Heart disease Maternal Grandfather     Social History   Social History  . Marital status: Single    Spouse name: N/A  . Number of children: N/A  . Years of education: N/A   Social History Main Topics  . Smoking status: Never Smoker  . Smokeless tobacco: Never Used  . Alcohol use No     Comment: occaisionally  . Drug use: No  . Sexual activity: Yes     Comment: Implanon 2010   Other Topics Concern  . None   Social History Narrative  . None    No Known Allergies  No current facility-administered medications on file prior to encounter.    Current Outpatient Prescriptions on File Prior to Encounter  Medication Sig Dispense Refill  . Prenatal Vit-Fe Fumarate-FA (PRENATAL MULTIVITAMIN) TABS tablet Take 1 tablet by mouth daily at 12 noon.       Review of Systems  Gastrointestinal: Positive for abdominal pain.  Genitourinary: Positive for vaginal discharge. Negative for vaginal bleeding.   Physical Exam   Vitals:    10/24/16 1935  BP: 137/75  Pulse: 96  Resp: 20  Temp: 99.2 F (37.3 C)  TempSrc: Oral  Weight: 203 lb 4 oz (92.2 kg)  Height: 5\' 5"  (1.651 m)    Physical Exam  Nursing note and vitals reviewed. Constitutional: She is oriented to person, place, and time. She appears well-developed and well-nourished. No distress.  HENT:  Head: Normocephalic and atraumatic.  Eyes: Conjunctivae are normal. Right eye exhibits no discharge. Left eye exhibits no discharge. No scleral icterus.  Neck: Normal range of motion.  Cardiovascular:  No murmur Mccall. Respiratory: Effort normal. No respiratory distress.  Genitourinary: Vaginal discharge (small amount of white mucoid discharge, no pooling) found.  Neurological: She is alert and oriented to person, place, and time.  Skin: Skin is warm and dry. She is not diaphoretic.  Psychiatric: She has a normal mood and affect. Her behavior is normal. Judgment and thought content normal.   Fetal Tracing:  Baseline: 130 Variability: moderate Accelerations: 15x15 Decelerations: none  Toco: ~ every 10 mins MAU Course  Procedures Results for orders placed or performed during the hospital  encounter of 10/24/16 (from the past 24 hour(s))  [facility]     Status: None   Collection Time: 10/24/16  8:18 PM  Result Value Ref Range   Amnisure ROM POSITIVE   POCT fern test     Status: None   Collection Time: 10/24/16  8:22 PM  Result Value Ref Range   POCT Fern Test Positive = ruptured amniotic membanes     MDM Reactive fetal tracing No pooling, fern inconclusive, amnisure ordered Amnisure positive  Assessment and Plan  A: 1. Amniotic fluid leaking    P: RN to call Dr. Senaida Oresichardson for further orders   [name], NP 10/24/2016 8:07 PM

## 2016-10-25 ENCOUNTER — Encounter (HOSPITAL_COMMUNITY): Payer: Self-pay | Admitting: Anesthesiology

## 2016-10-25 ENCOUNTER — Inpatient Hospital Stay (HOSPITAL_COMMUNITY): Payer: Medicaid Other | Admitting: Anesthesiology

## 2016-10-25 MED ORDER — IBUPROFEN 600 MG PO TABS
600.0000 mg | ORAL_TABLET | Freq: Four times a day (QID) | ORAL | Status: DC
  Administered 2016-10-25 – 2016-10-26 (×4): 600 mg via ORAL
  Filled 2016-10-25 (×4): qty 1

## 2016-10-25 MED ORDER — ONDANSETRON HCL 4 MG PO TABS: 4.0000 mg | ORAL_TABLET | ORAL | Status: DC | PRN

## 2016-10-25 MED ORDER — PRENATAL MULTIVITAMIN CH
1.0000 | ORAL_TABLET | Freq: Every day | ORAL | Status: DC
  Administered 2016-10-25: 1 via ORAL
  Filled 2016-10-25: qty 1

## 2016-10-25 MED ORDER — DIBUCAINE 1 % RE OINT: 1.0000 "application " | TOPICAL_OINTMENT | RECTAL | Status: DC | PRN

## 2016-10-25 MED ORDER — COCONUT OIL OIL: 1.0000 "application " | TOPICAL_OIL | Status: DC | PRN

## 2016-10-25 MED ORDER — TETANUS-DIPHTH-ACELL PERTUSSIS 5-2.5-18.5 LF-MCG/0.5 IM SUSP: 0.5000 mL | Freq: Once | INTRAMUSCULAR | Status: DC

## 2016-10-25 MED ORDER — ONDANSETRON HCL 4 MG/2ML IJ SOLN: 4.0000 mg | INTRAMUSCULAR | Status: DC | PRN

## 2016-10-25 MED ORDER — ZOLPIDEM TARTRATE 5 MG PO TABS: 5.0000 mg | ORAL_TABLET | Freq: Every evening | ORAL | Status: DC | PRN

## 2016-10-25 MED ORDER — SIMETHICONE 80 MG PO CHEW: 80.0000 mg | CHEWABLE_TABLET | ORAL | Status: DC | PRN

## 2016-10-25 MED ORDER — LIDOCAINE HCL (PF) 1 % IJ SOLN
INTRAMUSCULAR | Status: DC | PRN
  Administered 2016-10-25 (×2): 4 mL via EPIDURAL

## 2016-10-25 MED ORDER — SENNOSIDES-DOCUSATE SODIUM 8.6-50 MG PO TABS
2.0000 | ORAL_TABLET | ORAL | Status: DC
  Administered 2016-10-25: 2 via ORAL
  Filled 2016-10-25: qty 2

## 2016-10-25 MED ORDER — DIPHENHYDRAMINE HCL 25 MG PO CAPS: 25.0000 mg | ORAL_CAPSULE | Freq: Four times a day (QID) | ORAL | Status: DC | PRN

## 2016-10-25 MED ORDER — WITCH HAZEL-GLYCERIN EX PADS
1.0000 "application " | MEDICATED_PAD | CUTANEOUS | Status: DC | PRN
  Administered 2016-10-25: 1 via TOPICAL

## 2016-10-25 MED ORDER — OXYCODONE HCL 5 MG PO TABS
5.0000 mg | ORAL_TABLET | ORAL | Status: DC | PRN
  Administered 2016-10-25: 5 mg via ORAL
  Filled 2016-10-25: qty 1

## 2016-10-25 MED ORDER — BENZOCAINE-MENTHOL 20-0.5 % EX AERO
1.0000 "application " | INHALATION_SPRAY | CUTANEOUS | Status: DC | PRN
  Filled 2016-10-25: qty 56

## 2016-10-25 MED ORDER — ACETAMINOPHEN 325 MG PO TABS
650.0000 mg | ORAL_TABLET | ORAL | Status: DC | PRN
  Administered 2016-10-25 (×2): 650 mg via ORAL
  Filled 2016-10-25 (×2): qty 2

## 2016-10-25 MED ORDER — OXYCODONE HCL 5 MG PO TABS
10.0000 mg | ORAL_TABLET | ORAL | Status: DC | PRN
  Administered 2016-10-25: 10 mg via ORAL
  Filled 2016-10-25: qty 2

## 2016-10-25 NOTE — Progress Notes (Signed)
Patient ID: Kathleen BernheimChristina Cuccia, female   DOB: 02/18/1991, 26 y.o.   MRN: [id number] Pt was moved to L&D and started on Pitocin.  She was up to 10mu and had two prolongd variable decels so the pitocin was d/c until tracing returned to Category 1 and was then restarted at 5mu.  She received an epidural and then began to have deep variable decelerations so the pitocin was discontinued again.  She was 6cm, so the pitocin was left off and FHR observed with resolution of variables and baseline 115-120.   She then progressed quickly to dilation and was prepped for delivery.

## 2016-10-25 NOTE — Progress Notes (Signed)
Post Partum Day 0 Subjective: no complaints, up ad lib, voiding, tolerating PO and nl lochia, pain controlled  Objective: Blood pressure 133/77, pulse 67, temperature 98.3 F (36.8 C), resp. rate 18, height 5\' 5"  (1.651 m), weight 92.1 kg (203 lb), SpO2 99 %, unknown if currently breastfeeding.  Physical Exam:  General: alert and no distress Lochia: appropriate Uterine Fundus: firm   Recent Labs  10/24/16 2140  HGB 10.7*  HCT 30.9*    Assessment/Plan: Breastfeeding and Lactation consult.  Routine postpartum care.     LOS: 1 day   Mccall, Kathleen Koy 10/25/2016, 8:29 AM

## 2016-10-25 NOTE — Progress Notes (Signed)
UR chart review completed.  

## 2016-10-25 NOTE — Anesthesia Procedure Notes (Signed)
Epidural Patient location during procedure: OB Start time: 10/25/2016 3:39 AM  Staffing Anesthesiologist: Mal AmabileFOSTER, Brileigh Sevcik Performed: anesthesiologist   Preanesthetic Checklist Completed: patient identified, site marked, surgical consent, pre-op evaluation, timeout performed, IV checked, risks and benefits discussed and monitors and equipment checked  Epidural Patient position: sitting Prep: site prepped and draped and DuraPrep Patient monitoring: continuous pulse ox and blood pressure Approach: midline Injection technique: LOR air  Needle:  Needle type: Tuohy  Needle gauge: 17 G Needle length: 9 cm and 9 Needle insertion depth: 7 cm Catheter type: closed end flexible Catheter size: 19 Gauge Catheter at skin depth: 12 cm Test dose: negative and Other  Assessment Events: blood not aspirated, injection not painful, no injection resistance, negative IV test and no paresthesia  Additional Notes Patient identified. Risks and benefits discussed including failed block, incomplete  Pain control, post dural puncture headache, nerve damage, paralysis, blood pressure Changes, nausea, vomiting, reactions to medications-both toxic and allergic and post Partum back pain. All questions were answered. Patient expressed understanding and wished to proceed. Sterile technique was used throughout procedure. Epidural site was Dressed with sterile barrier dressing. No paresthesias, signs of intravascular injection Or signs of intrathecal spread were encountered.  Patient was more comfortable after the epidural was dosed. Please see RN's note for documentation of vital signs and FHR which are stable.

## 2016-10-25 NOTE — Lactation Note (Signed)
This note was copied from a baby's chart. Lactation Consultation Note; Initial visit, baby now 8 hours old. Mom reports baby fed well after delivery but has not latched well since. She has given formula by bottle for 3 feedings. Reviewed normal newborn behavior the first 24 hours. Encouraged to always breast feed first to help him learn and to promote milk supply.BF brochure given. Reviewed our phone number, OP appointments and BFSG as resources for support after DC.No questions at present. Encouraged to call for assist when baby wakes for next feeding.   Patient Name: Kathleen Magda BernheimChristina Mccall [id number]'s Date: 10/25/2016 Reason for consult: Initial assessment   Maternal Data Formula Feeding for Exclusion: Yes Reason for exclusion: Mother's choice to formula and breast feed on admission Has patient been taught Hand Expression?: Yes Does the patient have breastfeeding experience prior to this delivery?: No  Feeding Feeding Type: Formula (mom wants to bottle feed only) Nipple Type: Slow - flow  LATCH Score/Interventions                      Lactation Tools Discussed/Used     Consult Status Consult Status: Follow-up Date: 10/26/16 Follow-up type: In-patient    Pamelia HoitWeeks, Lorraina Spring D 10/25/2016, 2:20 PM

## 2016-10-25 NOTE — Progress Notes (Signed)
Dr. Ellyn HackBovard aware of patient being Tachycardic at [id number]. No new orders. Scds on per MD order.

## 2016-10-25 NOTE — Anesthesia Preprocedure Evaluation (Signed)
Anesthesia Evaluation  Patient identified by MRN, date of birth, ID band Patient awake    Reviewed: Allergy & Precautions, H&P , NPO status , Patient's Chart, lab work & pertinent test results  Airway Mallampati: II  TM Distance: >3 FB Neck ROM: full    Dental no notable dental hx.    Pulmonary neg pulmonary ROS,    Pulmonary exam normal breath sounds clear to auscultation       Cardiovascular hypertension, negative cardio ROS Normal cardiovascular exam Rhythm:Regular Rate:Normal     Neuro/Psych negative neurological ROS  negative psych ROS   GI/Hepatic negative GI ROS, Neg liver ROS,   Endo/Other  Obesity  Renal/GU negative Renal ROS  negative genitourinary   Musculoskeletal negative musculoskeletal ROS (+)   Abdominal Normal abdominal exam  (+)   Peds  Hematology  (+) anemia ,   Anesthesia Other Findings   Reproductive/Obstetrics (+) Pregnancy                             Anesthesia Physical  Anesthesia Plan  ASA: II  Anesthesia Plan: Epidural   Post-op Pain Management:    Induction:   Airway Management Planned: Natural Airway  Additional Equipment:   Intra-op Plan:   Post-operative Plan:   Informed Consent: I have reviewed the patients History and Physical, chart, labs and discussed the procedure including the risks, benefits and alternatives for the proposed anesthesia with the patient or authorized representative who has indicated his/her understanding and acceptance.     Plan Discussed with: Anesthesiologist  Anesthesia Plan Comments:         Anesthesia Quick Evaluation

## 2016-10-25 NOTE — Anesthesia Postprocedure Evaluation (Signed)
Anesthesia Post Note  Patient: Medical illustratorChristina Mccall  Procedure(s) Performed: * No procedures listed *  Patient location during evaluation: Mother Baby Anesthesia Type: Epidural Level of consciousness: awake and alert Pain management: pain level controlled Vital Signs Assessment: post-procedure vital signs reviewed and stable Respiratory status: spontaneous breathing, nonlabored ventilation and respiratory function stable Cardiovascular status: stable Postop Assessment: no headache, no backache and epidural receding Anesthetic complications: no        Last Vitals:  Vitals:   10/25/16 0830 10/25/16 0900  BP: 116/68   Pulse: (!) 146 75  Resp: 18 18  Temp: 36.8 C 36.8 C    Last Pain:  Vitals:   10/25/16 1447  TempSrc:   PainSc: 3    Pain Goal: Patients Stated Pain Goal: 5 (10/24/16 2212)               Marrion CoyMERRITT,[name]

## 2016-10-26 LAB — CBC
HEMATOCRIT: 28 % — AB (ref 36.0–46.0)
HEMOGLOBIN: 9.5 g/dL — AB (ref 12.0–15.0)
MCH: 28.6 pg (ref 26.0–34.0)
MCHC: 33.9 g/dL (ref 30.0–36.0)
MCV: 84.3 fL (ref 78.0–100.0)
Platelets: 195 10*3/uL (ref 150–400)
RBC: 3.32 MIL/uL — AB (ref 3.87–5.11)
RDW: 13.2 % (ref 11.5–15.5)
WBC: 5.9 10*3/uL (ref 4.0–10.5)

## 2016-10-26 LAB — RPR: RPR Ser Ql: NONREACTIVE

## 2016-10-26 MED ORDER — IBUPROFEN 800 MG PO TABS: 800.0000 mg | ORAL_TABLET | Freq: Three times a day (TID) | ORAL | 1 refills | Status: AC | PRN

## 2016-10-26 MED ORDER — PRENATAL MULTIVITAMIN CH: 1.0000 | ORAL_TABLET | Freq: Every day | ORAL | 3 refills | Status: AC

## 2016-10-26 NOTE — Progress Notes (Addendum)
Post Partum Day 1 Subjective: no complaints, up ad lib, voiding, tolerating PO and nl lochia, pain controlled  Objective: Blood pressure 120/86, pulse 85, temperature 98.5 F (36.9 C), temperature source Oral, resp. rate 20, height 5\' 5"  (1.651 m), weight 92.1 kg (203 lb), SpO2 98 %, unknown if currently breastfeeding.  Physical Exam:  General: alert and no distress Lochia: appropriate Uterine Fundus: firm   Recent Labs  10/24/16 2140 10/26/16 0539  HGB 10.7* 9.5*  HCT 30.9* 28.0*    Assessment/Plan: Plan for discharge tomorrow, Breastfeeding and Lactation consult.  Routine care.    Pt desires d/c home - will d/c with motrin and PNV.  F/u 6 weeks   LOS: 2 days   Bovard-Stuckert, Sachiko Methot 10/26/2016, 8:22 AM

## 2016-10-26 NOTE — Discharge Summary (Signed)
OB Discharge Summary     Patient Name: Kathleen Mccall DOB: [birth year] MRN: [id number]  Date of admission: 10/24/2016 Delivering MD: Huel Cote   Date of discharge: 10/26/2016  Admitting diagnosis: 39w see if water broke, in labor, ctx Intrauterine pregnancy: [redacted]w[redacted]d     Secondary diagnosis:  Active Problems:   Ruptured, membranes, premature   NSVD (normal spontaneous vaginal delivery)  Additional problems: N/A     Discharge diagnosis: Term Pregnancy Delivered                                                                                                Post partum procedures:N/A  Augmentation: Pitocin  Complications: None  Hospital course:  Onset of Labor With Vaginal Delivery     26 y.o. yo [id number] at [redacted]w[redacted]d was admitted in Active Labor on 10/24/2016. Patient had an uncomplicated labor course as follows:  Membrane Rupture Time/Date: 3:00 PM ,10/24/2016   Intrapartum Procedures: Episiotomy: None [1]                                         Lacerations:  1st degree [2]  Patient had a delivery of a Viable infant. 10/25/2016  Information for the patient's newborn:  Shelonda, Saxe [[id number]]  Delivery Method: Vaginal, Spontaneous Delivery (Filed from Delivery Summary)    Pateint had an uncomplicated postpartum course.  She is ambulating, tolerating a regular diet, passing flatus, and urinating well. Patient is discharged home in stable condition on 10/26/16.   Physical exam  Vitals:   10/25/16 1200 10/25/16 1716 10/25/16 1957 10/26/16 0538  BP: 132/68 (!) 151/92 136/73 120/86  Pulse: 78 75 71 85  Resp: 18 17 20 20   Temp: 98.3 F (36.8 C) 98.3 F (36.8 C) 97.9 F (36.6 C) 98.5 F (36.9 C)  TempSrc: Oral Oral  Oral  SpO2:      Weight:      Height:       General: alert and no distress Lochia: appropriate Uterine Fundus: firm  Labs: Lab Results  Component Value Date   WBC 5.9 10/26/2016   HGB 9.5 (L) 10/26/2016   HCT 28.0 (L) 10/26/2016   MCV 84.3  10/26/2016   PLT 195 10/26/2016   CMP Latest Ref Rng & Units 05/02/2015  Glucose 65 - 99 mg/dL 74  BUN 6 - 20 mg/dL 7  Creatinine 1.30 - 8.65 mg/dL 7.84  Sodium 696 - 295 mmol/L 137  Potassium 3.5 - 5.1 mmol/L 4.3  Chloride 101 - 111 mmol/L 106  CO2 22 - 32 mmol/L 23  Calcium 8.9 - 10.3 mg/dL 2.8(U)  Total Protein 6.5 - 8.1 g/dL 6.8  Total Bilirubin 0.3 - 1.2 mg/dL 0.8  Alkaline Phos 38 - 126 U/L 129(H)  AST 15 - 41 U/L 27  ALT 14 - 54 U/L 16    Discharge instruction: per After Visit Summary and "Baby and Me Booklet".  After visit meds:  Allergies as of 10/26/2016   No Known Allergies  Medication List    TAKE these medications   ibuprofen 800 MG tablet Commonly known as:  ADVIL,MOTRIN Take 1 tablet (800 mg total) by mouth every 8 (eight) hours as needed.   prenatal multivitamin Tabs tablet Take 1 tablet by mouth daily at 12 noon.       Diet: routine diet  Activity: Advance as tolerated. Pelvic rest for 6 weeks.   Outpatient follow up:6 weeks Follow up Appt:No future appointments. Follow up Visit:No Follow-up on file.  Postpartum contraception: Undecided  Newborn Data: Live born female  Birth Weight: 5 lb 15.2 oz (2700 g) APGAR: 8, 9  Baby Feeding: Breast Disposition:home with mother   10/26/2016 Sherian ReinBovard-Stuckert, Desarea Ohagan, MD

## 2016-10-26 NOTE — Lactation Note (Signed)
This note was copied from a baby's chart. Lactation Consultation Note  Mother states she wants to only formula feed. Discussed applying cabbage leaves to breasts and engorgement care.  Patient Name: Kathleen Mccall's Date: 10/26/2016     Maternal Data    Feeding Feeding Type: Bottle Fed - Formula Nipple Type: Slow - flow  LATCH Score/Interventions                      Lactation Tools Discussed/Used     Consult Status      [name], Ruth Boschen 10/26/2016, 10:45 AM
